# Patient Record
Sex: Female | Born: 1985 | Race: Black or African American | Hispanic: No | Marital: Single | Smoking: Former smoker
Health system: Southern US, Community
[De-identification: ages and names within clinical notes are randomized; demographics above are authoritative.]

## PROBLEM LIST (undated history)

## (undated) ENCOUNTER — Inpatient Hospital Stay (HOSPITAL_COMMUNITY): Payer: Self-pay

## (undated) DIAGNOSIS — Z8619 Personal history of other infectious and parasitic diseases: Secondary | ICD-10-CM

## (undated) DIAGNOSIS — R87619 Unspecified abnormal cytological findings in specimens from cervix uteri: Secondary | ICD-10-CM

## (undated) DIAGNOSIS — O139 Gestational [pregnancy-induced] hypertension without significant proteinuria, unspecified trimester: Secondary | ICD-10-CM

## (undated) DIAGNOSIS — I10 Essential (primary) hypertension: Secondary | ICD-10-CM

## (undated) DIAGNOSIS — L0291 Cutaneous abscess, unspecified: Secondary | ICD-10-CM

## (undated) DIAGNOSIS — IMO0002 Reserved for concepts with insufficient information to code with codable children: Secondary | ICD-10-CM

## (undated) DIAGNOSIS — A599 Trichomoniasis, unspecified: Secondary | ICD-10-CM

## (undated) DIAGNOSIS — A6009 Herpesviral infection of other urogenital tract: Secondary | ICD-10-CM

## (undated) DIAGNOSIS — R87629 Unspecified abnormal cytological findings in specimens from vagina: Secondary | ICD-10-CM

## (undated) DIAGNOSIS — K219 Gastro-esophageal reflux disease without esophagitis: Secondary | ICD-10-CM

## (undated) DIAGNOSIS — IMO0001 Reserved for inherently not codable concepts without codable children: Secondary | ICD-10-CM

## (undated) HISTORY — DX: Unspecified abnormal cytological findings in specimens from vagina: R87.629

## (undated) HISTORY — PX: DILATION AND CURETTAGE OF UTERUS: SHX78

## (undated) HISTORY — DX: Personal history of other infectious and parasitic diseases: Z86.19

## (undated) HISTORY — PX: EYE SURGERY: SHX253

---
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
## 2008-01-29 ENCOUNTER — Ambulatory Visit: Payer: Self-pay | Admitting: Physician Assistant

## 2008-01-29 ENCOUNTER — Inpatient Hospital Stay (HOSPITAL_COMMUNITY): Admission: AD | Admit: 2008-01-29 | Discharge: 2008-01-29 | Payer: Self-pay | Admitting: Obstetrics & Gynecology

## 2008-02-10 ENCOUNTER — Inpatient Hospital Stay (HOSPITAL_COMMUNITY): Admission: AD | Admit: 2008-02-10 | Discharge: 2008-02-10 | Payer: Self-pay | Admitting: Obstetrics & Gynecology

## 2008-02-18 ENCOUNTER — Inpatient Hospital Stay (HOSPITAL_COMMUNITY): Admission: RE | Admit: 2008-02-18 | Discharge: 2008-02-18 | Payer: Self-pay | Admitting: Obstetrics & Gynecology

## 2008-03-11 ENCOUNTER — Ambulatory Visit (HOSPITAL_COMMUNITY): Admission: RE | Admit: 2008-03-11 | Discharge: 2008-03-11 | Payer: Self-pay | Admitting: Obstetrics

## 2008-03-11 ENCOUNTER — Encounter (INDEPENDENT_AMBULATORY_CARE_PROVIDER_SITE_OTHER): Payer: Self-pay | Admitting: Obstetrics

## 2008-04-11 ENCOUNTER — Emergency Department (HOSPITAL_COMMUNITY): Admission: EM | Admit: 2008-04-11 | Discharge: 2008-04-11 | Payer: Self-pay | Admitting: Emergency Medicine

## 2009-05-06 ENCOUNTER — Emergency Department (HOSPITAL_COMMUNITY): Admission: EM | Admit: 2009-05-06 | Discharge: 2009-05-06 | Payer: Self-pay | Admitting: Emergency Medicine

## 2009-05-09 ENCOUNTER — Emergency Department (HOSPITAL_COMMUNITY): Admission: EM | Admit: 2009-05-09 | Discharge: 2009-05-09 | Payer: Self-pay | Admitting: Family Medicine

## 2010-01-30 ENCOUNTER — Encounter: Payer: Self-pay | Admitting: Obstetrics

## 2010-03-29 LAB — CULTURE, ROUTINE-ABSCESS

## 2010-04-04 IMAGING — US US OB COMP LESS 14 WK
2 series · 14 of 28 positions shown · non-contrast
Comparison: none

OBSTETRICAL ULTRASOUND:
 This ultrasound exam was performed in the [HOSPITAL] Ultrasound Department.  The OB US report was generated in the AS system, and faxed to the ordering physician.  This report is also available in [REDACTED] PACS.

[Series 1: us ob comp less 14 wks · 47 acquisitions, 13 frames shown (1 of 2)]
[im 2/47]
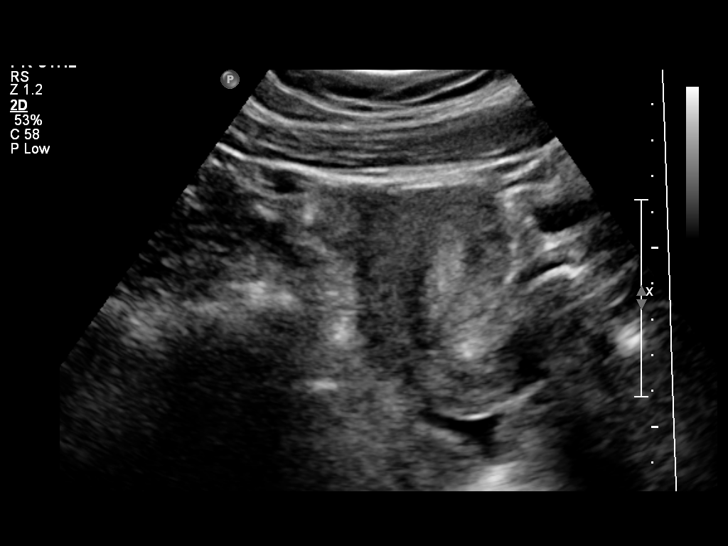
[im 6/47]
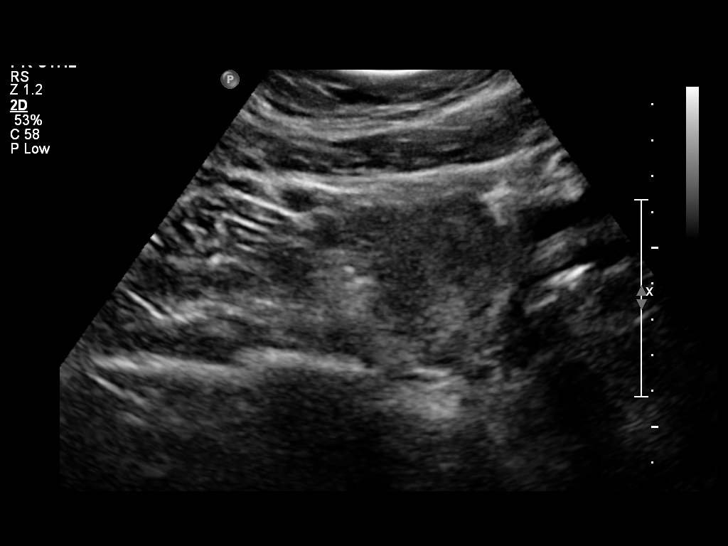
[im 9/47]
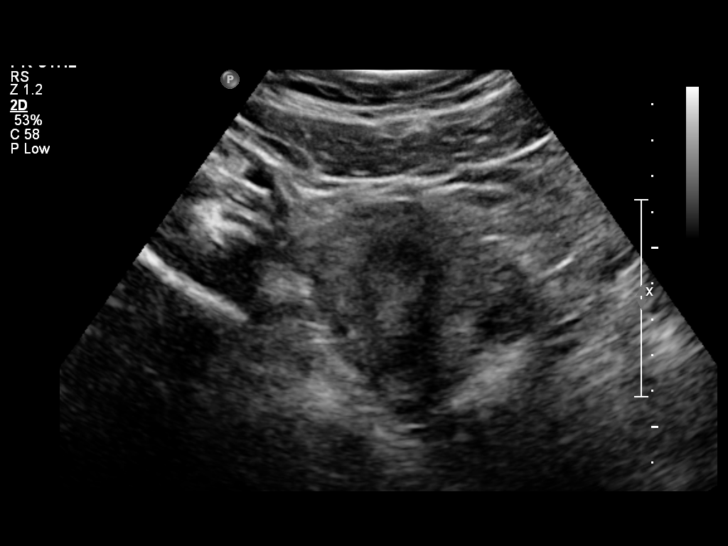
[im 13/47]
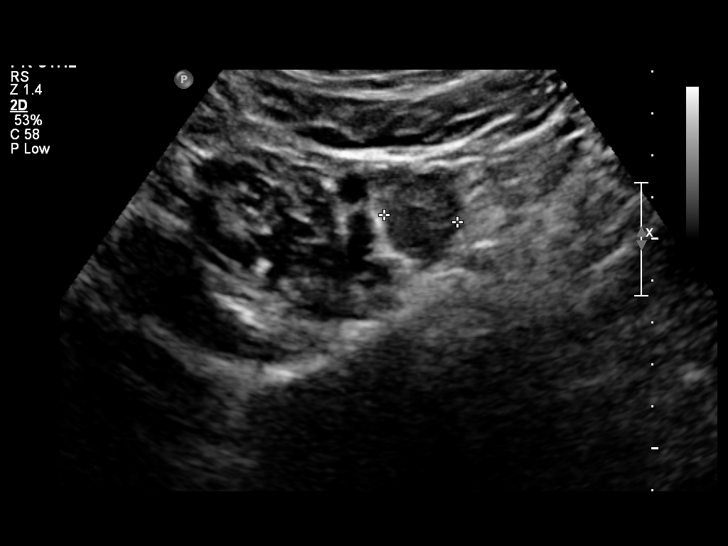
[im 16/47]
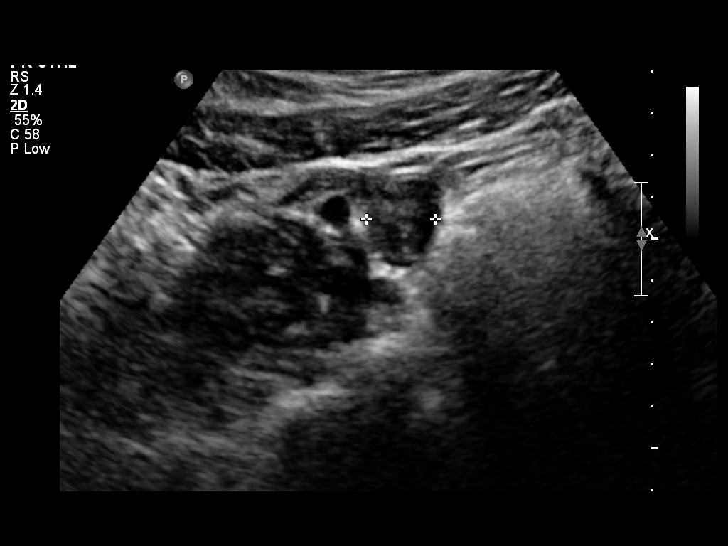
[im 20/47]
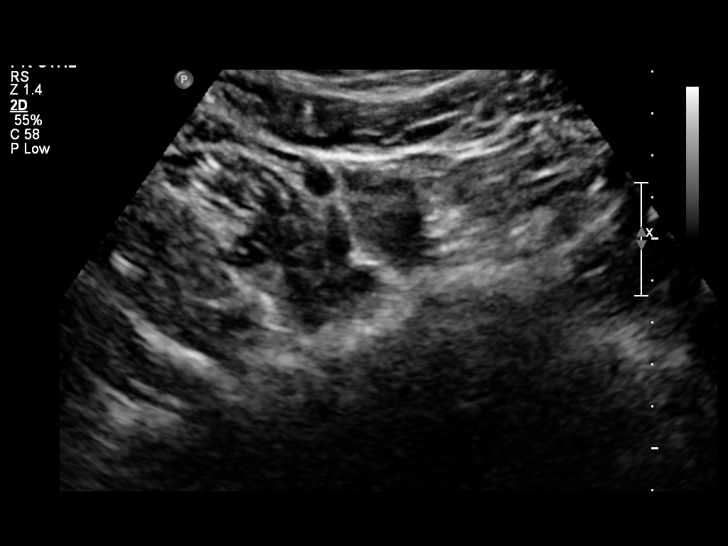
[im 24/47]
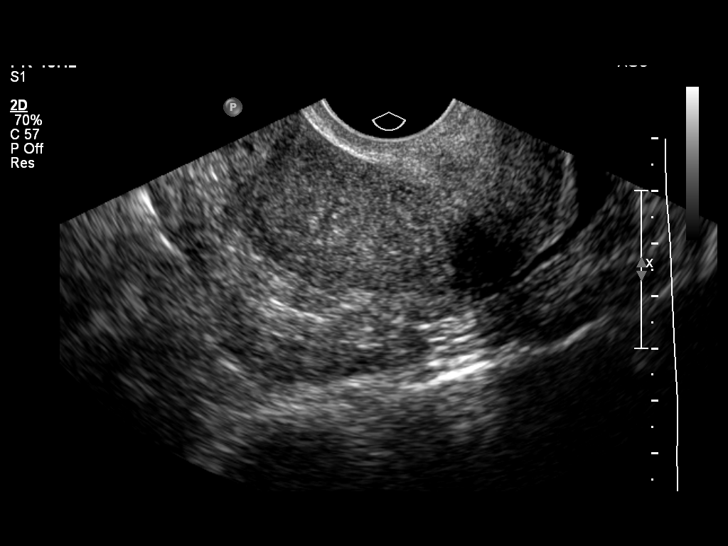
[im 27/47]
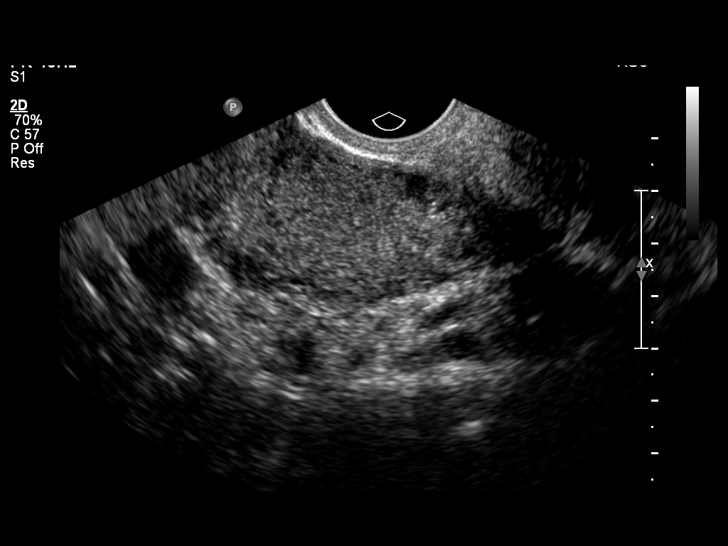
[im 31/47]
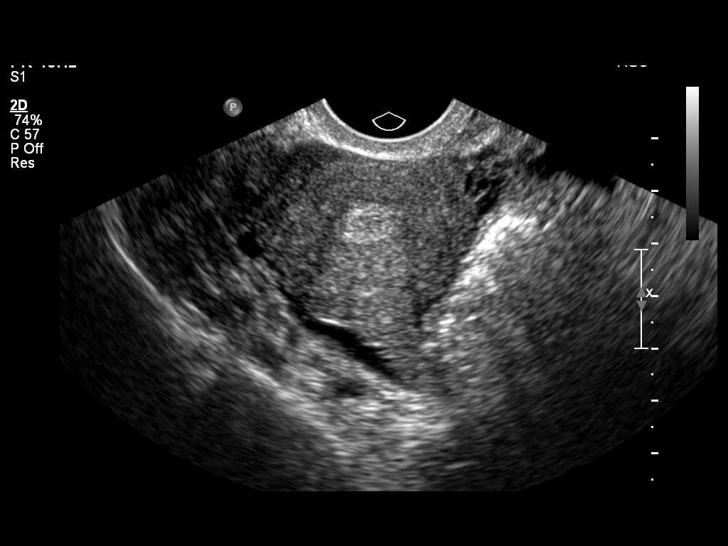
[im 34/47]
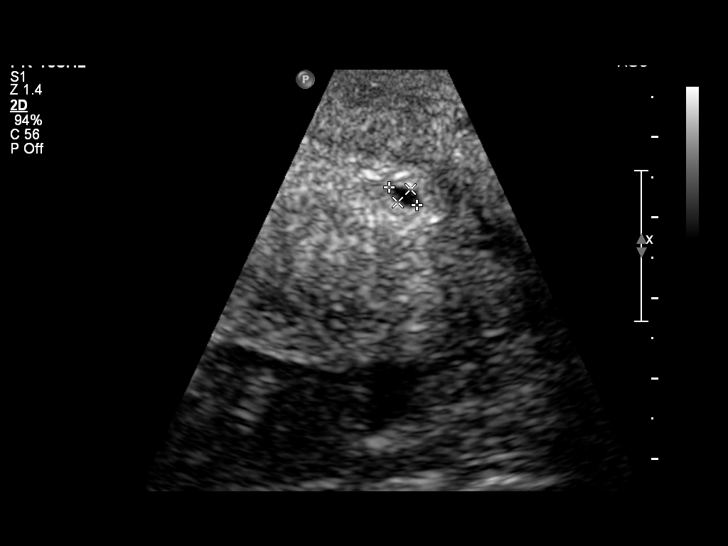
[im 38/47]
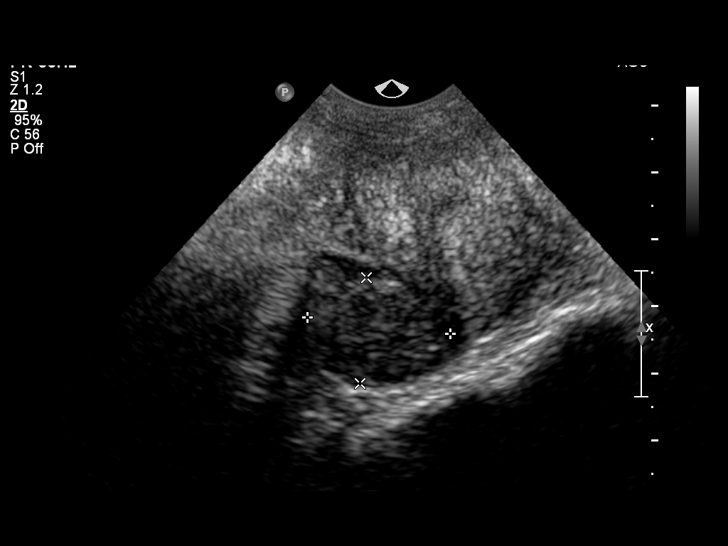
[im 41/47]
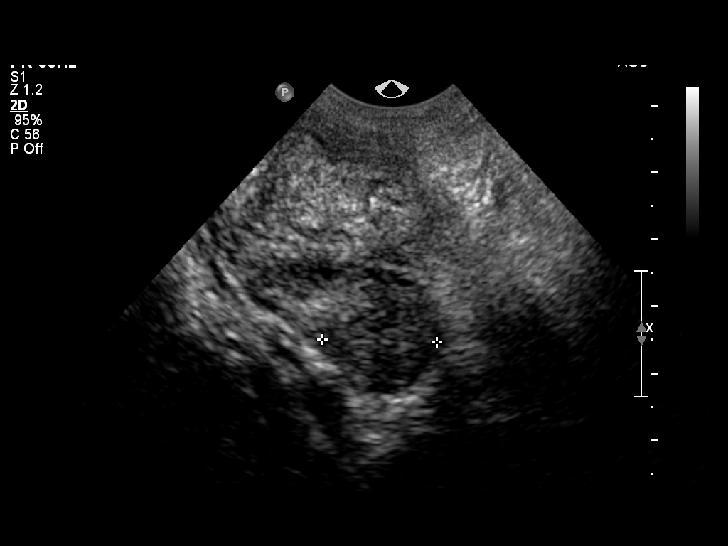
[im 45/47]
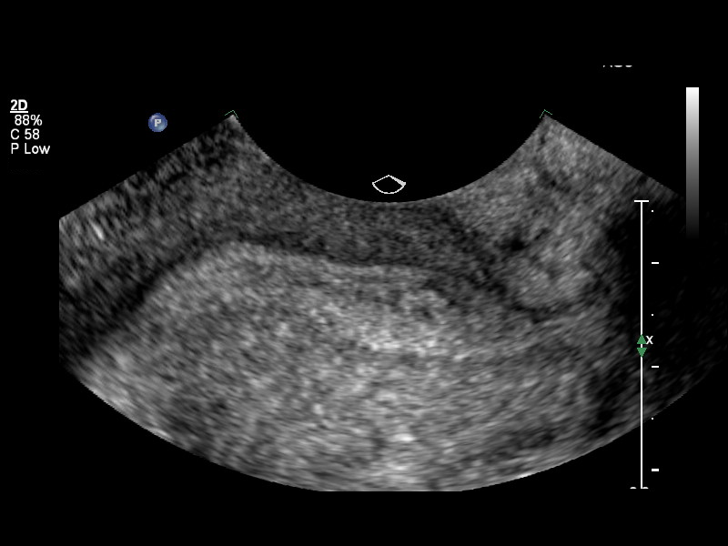

[Series 4: us ob comp less 14 wks · 1 of 256 frames shown (2 of 2)]
[frame 129/256]
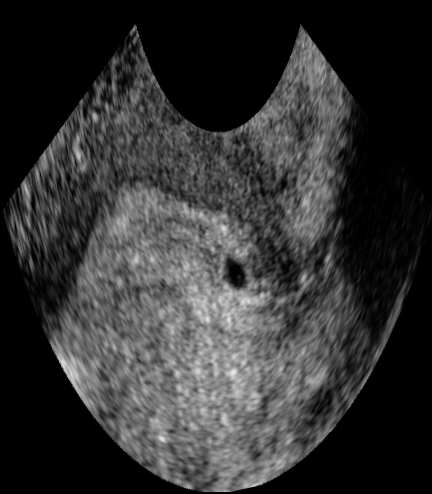

[14 of 28 positions shown; findings below may reference images not displayed]

IMPRESSION: See AS Obstetric US report.

## 2010-04-25 LAB — GC/CHLAMYDIA PROBE AMP, GENITAL

## 2010-04-25 LAB — URINALYSIS, ROUTINE W REFLEX MICROSCOPIC
Hgb urine dipstick: NEGATIVE
Specific Gravity, Urine: 1.025 (ref 1.005–1.030)
Urobilinogen, UA: 0.2 mg/dL (ref 0.0–1.0)

## 2010-04-25 LAB — CBC
Hemoglobin: 14.1 g/dL (ref 12.0–15.0)
Platelets: 224 10*3/uL (ref 150–400)
RDW: 14.9 % (ref 11.5–15.5)

## 2010-04-25 LAB — POCT PREGNANCY, URINE

## 2010-04-25 LAB — ABO/RH: ABO/RH(D): B POS

## 2010-04-26 LAB — WET PREP, GENITAL: Yeast Wet Prep HPF POC: NONE SEEN

## 2010-04-26 LAB — CBC
Hemoglobin: 13.3 g/dL (ref 12.0–15.0)
Platelets: 203 10*3/uL (ref 150–400)
RDW: 14.5 % (ref 11.5–15.5)

## 2010-05-24 NOTE — Op Note (Signed)
NAMEBENIGNA, DELISI              ACCOUNT NO.:  [id number]   MEDICAL RECORD NO.:  [id number]          PATIENT TYPE:  AMB   LOCATION:  SDC                           FACILITY:  [facility]   PHYSICIAN:  Kathreen Cosier, M.D.DATE OF BIRTH:  1985-07-21   DATE OF PROCEDURE:  03/11/2008  DATE OF DISCHARGE:                               OPERATIVE REPORT   PREOPERATIVE DIAGNOSIS:  Intrauterine fetal demise with twins at 7.5  weeks' gestation.   POSTOPERATIVE DIAGNOSIS:  Intrauterine fetal demise with twins at 7.5  weeks' gestation.   PROCEDURE:  Dilatation and evacuation.   DESCRIPTION OF PROCEDURE:  Using MAC, the patient's, lithotomy position,  perineum and vagina prepped and draped.  Bladder emptied with straight  catheter.  Bimanual exam was difficult because of the obesity of the  patient's abdomen, but it was felt that the uterus on both 12 weeks'  size.  Speculum placed in the vagina.  Cervix injected with 9 mL of 1%  Xylocaine.  Endometrial cavity sounded 10 cm.  Cervix dilated #27 Shawnie Pons  and #9 suction used to aspirate the uterine contents until the cavity  was clean.  Ultrasound was obtained because of the obesity of her  abdomen and this confirmed that the cavity was clean.  The patient  tolerated the procedure well and taken to recovery room in good  condition.            ______________________________  Kathreen Cosier, M.D.     BAM/MEDQ  D:  03/11/2008  T:  03/11/2008  Job:  [id number]

## 2010-05-27 NOTE — Op Note (Signed)
[place]  Patient:    Amber Duran, Amber Duran                     MRN: [id number] Proc. Date: 05/04/00 Adm. Date:  [id number] Attending:  Shara Blazing                           Operative Report  PREOPERATIVE DIAGNOSIS:  Chalazion, left upper eyelid with pyogenic granuloma.  POSTOPERATIVE DIAGNOSIS:  Chalazion, left upper eyelid with pyogenic granuloma.  PROCEDURE:  Excision of pyogenic granuloma, left upper eyelid with steroid injection into chalazion.  SURGEON:  Pasty Spillers. Maple Hudson, M.D.  ANESTHESIA:  General (mask inhalation).  COMPLICATIONS:  None.  DESCRIPTION OF PROCEDURE:  After routine preoperative evaluation including informed consent, the patient was taken to the operating room where she was identified by me.  General anesthesia was induced without difficulty after placement on appropriate monitors.  The skin around the right eye was prepped with 5% Betadine solution.  The pyogenic granuloma was located at the temporal lid margin of the left upper lid, and was found to measure approximately 4 mm in diameter, elevated approximately 2 mm.  It was excised in one piece with Westcott scissors. Approximately 0.3 cc of triamcinolone, 40 mg/cc was injected into the underlying chalazion.  Polysporin ophthalmic ointment was placed on the wound.  The patient was awakened without difficulty and taken to the recovery room in stable condition having suffered no intraoperative or immediate postoperative complications. DD:  05/04/00 TD:  05/05/00 Job: [id number]

## 2010-06-10 ENCOUNTER — Other Ambulatory Visit (HOSPITAL_COMMUNITY): Payer: Self-pay | Admitting: Obstetrics

## 2010-06-10 ENCOUNTER — Inpatient Hospital Stay (HOSPITAL_COMMUNITY)
Admission: AD | Admit: 2010-06-10 | Discharge: 2010-06-10 | Disposition: A | Discharge status: Home / Self Care | Payer: Medicaid Other | Source: Ambulatory Visit | Attending: Obstetrics | Admitting: Obstetrics

## 2010-06-10 ENCOUNTER — Encounter: Payer: Self-pay | Admitting: Obstetrics

## 2010-06-10 ENCOUNTER — Ambulatory Visit (HOSPITAL_COMMUNITY)
Admission: RE | Admit: 2010-06-10 | Discharge: 2010-06-10 | Disposition: A | Discharge status: Home / Self Care | Payer: Medicaid Other | Source: Ambulatory Visit | Attending: Obstetrics | Admitting: Obstetrics

## 2010-06-10 DIAGNOSIS — O035 Genital tract and pelvic infection following complete or unspecified spontaneous abortion: Secondary | ICD-10-CM

## 2010-06-10 DIAGNOSIS — Z3689 Encounter for other specified antenatal screening: Secondary | ICD-10-CM | POA: Insufficient documentation

## 2010-06-10 DIAGNOSIS — O021 Missed abortion: Secondary | ICD-10-CM

## 2010-06-10 DIAGNOSIS — O98519 Other viral diseases complicating pregnancy, unspecified trimester: Secondary | ICD-10-CM | POA: Insufficient documentation

## 2010-06-10 DIAGNOSIS — A6000 Herpesviral infection of urogenital system, unspecified: Secondary | ICD-10-CM | POA: Insufficient documentation

## 2010-06-10 LAB — URINALYSIS, ROUTINE W REFLEX MICROSCOPIC
Bilirubin Urine: NEGATIVE
Hgb urine dipstick: NEGATIVE
Ketones, ur: 15 mg/dL — AB
Protein, ur: NEGATIVE mg/dL
Urobilinogen, UA: 0.2 mg/dL (ref 0.0–1.0)

## 2010-06-11 LAB — GC/CHLAMYDIA PROBE AMP, GENITAL

## 2010-09-14 ENCOUNTER — Inpatient Hospital Stay (HOSPITAL_COMMUNITY)
Admission: AD | Admit: 2010-09-14 | Discharge: 2010-09-14 | Disposition: A | Discharge status: Home / Self Care | Payer: Medicaid Other | Source: Ambulatory Visit | Attending: Obstetrics | Admitting: Obstetrics

## 2010-09-14 ENCOUNTER — Inpatient Hospital Stay (HOSPITAL_COMMUNITY): Payer: Medicaid Other

## 2010-09-14 ENCOUNTER — Encounter (HOSPITAL_COMMUNITY): Payer: Self-pay | Admitting: *Deleted

## 2010-09-14 ENCOUNTER — Inpatient Hospital Stay (INDEPENDENT_AMBULATORY_CARE_PROVIDER_SITE_OTHER)
Admission: RE | Admit: 2010-09-14 | Discharge: 2010-09-14 | Disposition: A | Discharge status: Home / Self Care | Payer: Medicaid Other | Source: Ambulatory Visit | Attending: Family Medicine | Admitting: Family Medicine

## 2010-09-14 DIAGNOSIS — O021 Missed abortion: Secondary | ICD-10-CM | POA: Insufficient documentation

## 2010-09-14 DIAGNOSIS — N949 Unspecified condition associated with female genital organs and menstrual cycle: Secondary | ICD-10-CM

## 2010-09-14 DIAGNOSIS — Z331 Pregnant state, incidental: Secondary | ICD-10-CM

## 2010-09-14 HISTORY — DX: Cutaneous abscess, unspecified: L02.91

## 2010-09-14 HISTORY — DX: Herpesviral infection of other urogenital tract: A60.09

## 2010-09-14 HISTORY — DX: Trichomoniasis, unspecified: A59.9

## 2010-09-14 HISTORY — DX: Essential (primary) hypertension: I10

## 2010-09-14 LAB — CBC
HCT: 44.6 % (ref 36.0–46.0)
MCV: 91.2 fL (ref 78.0–100.0)
RBC: 4.89 MIL/uL (ref 3.87–5.11)
WBC: 9.7 10*3/uL (ref 4.0–10.5)

## 2010-09-14 LAB — POCT URINALYSIS DIP (DEVICE)
Ketones, ur: 80 mg/dL — AB
Nitrite: NEGATIVE

## 2010-09-14 MED ORDER — HYDROCODONE-ACETAMINOPHEN 5-500 MG PO TABS: 1.0000 | ORAL_TABLET | Freq: Four times a day (QID) | ORAL | Status: DC | PRN

## 2010-09-14 MED ORDER — PROMETHAZINE HCL 25 MG PO TABS: 25.0000 mg | ORAL_TABLET | Freq: Four times a day (QID) | ORAL | Status: DC | PRN

## 2010-09-14 NOTE — Progress Notes (Signed)
Started on Sun, kind like GI pain- crampy, took pepto-no relief.  Has continued, gotten worse. No bleeding.  Manson Passey d/c

## 2010-09-14 NOTE — ED Provider Notes (Signed)
History   Pt presents today c/o lower abd pain for the past 2 months. She had a missed AB several months ago and was given 2 doses of cytotec. She states she thinks she had menses in July but is not certain of the dates. She states she has recently been feeling like she had the flu and these sx have been present for the past 1-2 months.  Chief Complaint  Patient presents with  . Abdominal Pain   HPI  OB History    Grav Para Term Preterm Abortions TAB SAB Ect Mult Living   1               No past medical history on file.  No past surgical history on file.  No family history on file.  History  Substance Use Topics  . Smoking status: Not on file  . Smokeless tobacco: Not on file  . Alcohol Use: Not on file    Allergies: No Known Allergies  Prescriptions prior to admission  Medication Sig Dispense Refill  . oxyCODONE-acetaminophen (PERCOCET) 5-325 MG per tablet Take 1 tablet by mouth every 4 (four) hours as needed. For pain       . valACYclovir (VALTREX) 500 MG tablet Take 500 mg by mouth 2 (two) times daily.          Review of Systems  Constitutional: Positive for malaise/fatigue. Negative for fever.  Cardiovascular: Negative for chest pain.  Gastrointestinal: Positive for abdominal pain. Negative for nausea, vomiting and diarrhea.  Genitourinary: Negative for dysuria, urgency, frequency and hematuria.  Neurological: Negative for dizziness and headaches.  Psychiatric/Behavioral: Negative for depression and suicidal ideas.   Physical Exam   Blood pressure 112/72, pulse 76, temperature 98.7 F (37.1 C), temperature source Oral, resp. rate 20, height 5\' 2"  (1.575 m), weight 162 lb (73.483 kg).  Physical Exam  MAU Course  Procedures  Results for orders placed during the hospital encounter of 09/14/10 (from the past 24 hour(s))  CBC     Status: Normal   Collection Time   09/14/10  3:56 PM      Component Value Range   WBC 9.7  4.0 - 10.5 (K/uL)   RBC 4.89  3.87 - 5.11  (MIL/uL)   Hemoglobin 14.9  12.0 - 15.0 (g/dL)   HCT 30.8  65.7 - 84.6 (%)   MCV 91.2  78.0 - 100.0 (fL)   MCH 30.5  26.0 - 34.0 (pg)   MCHC 33.4  30.0 - 36.0 (g/dL)   RDW 96.2  95.2 - 84.1 (%)   Platelets 164  150 - 400 (K/uL)  HCG, QUANTITATIVE, PREGNANCY     Status: Abnormal   Collection Time   09/14/10  3:56 PM      Component Value Range   hCG, Beta Chain, Quant, S 15 (*) <5 (mIU/mL)   US shows IUFD at 7wks.  Discussed pt with Dr. Gaynell Face. He wishes to see pt in his office tomorrow at 1pm.   Assessment and Plan  Missed AB with failed cytotec x 2: discussed with pt at length. Will give Rx for phenergan and lortab. She will f/u with Dr. Gaynell Face tomorrow at 1pm. Discussed diet, activity, risks, and precautions.  Clinton Gallant. Rice III, DrHSc, MPAS, PA-C  09/14/2010, 4:21 PM   Henrietta Hoover, PA 09/14/10 1814

## 2010-09-15 ENCOUNTER — Encounter (HOSPITAL_COMMUNITY): Payer: Self-pay | Admitting: *Deleted

## 2010-09-15 ENCOUNTER — Other Ambulatory Visit: Payer: Self-pay | Admitting: Obstetrics

## 2010-09-15 LAB — GC/CHLAMYDIA PROBE AMP, GENITAL
Chlamydia, DNA Probe: NEGATIVE
GC Probe Amp, Genital: NEGATIVE

## 2010-09-15 NOTE — H&P (Signed)
NAMEISATOU, Amber Duran              ACCOUNT NO.:  [id number]  MEDICAL RECORD NO.:  [id number]  LOCATION:  PERIO                         FACILITY:  [facility]  PHYSICIAN:  Kathreen Cosier, M.D.DATE OF BIRTH:  1985-05-14  DATE OF ADMISSION:  09/15/2010 DATE OF DISCHARGE:                             HISTORY & PHYSICAL   HISTORY OF PRESENT ILLNESS:  The patient is a 25 year old gravida 4, para 0-0-4-0, who in June had an intrauterine fetal demise, received Cytotec and passed some tissue.  Since that time the patient states that she has had occasional cramping and also some dark red bleeding, came to the hospital yesterday and an ultrasound showed a 7-week fetal demise. So the patient is for D and E.  PAST MEDICAL HISTORY:  She has a history of herpes with occasional outbreaks.  She also had hypertension at one time, blood pressures normal.  REVIEW OF SYSTEMS:  Negative.  FAMILY HISTORY:  Negative.  PHYSICAL EXAMINATION:  GENERAL:  A well-developed female in no distress. HEENT: Negative. LUNGS:  Clear. HEART:  Regular rhythm.  No murmurs or gallops. BREASTS:  No masses. ABDOMEN:  Negative. PELVIC:  Uterus 6-8 weeks' size.  Negative adnexa. VAGINA:  External genitalia normal. EXTREMITIES:  Negative.          ______________________________ Kathreen Cosier, M.D.     BAM/MEDQ  D:  09/15/2010  T:  09/15/2010  Job:  [id number]

## 2010-09-16 ENCOUNTER — Other Ambulatory Visit: Payer: Self-pay | Admitting: Obstetrics

## 2010-09-20 ENCOUNTER — Other Ambulatory Visit: Payer: Self-pay

## 2010-09-21 ENCOUNTER — Ambulatory Visit (HOSPITAL_COMMUNITY)
Admission: RE | Admit: 2010-09-21 | Discharge: 2010-09-21 | Disposition: A | Discharge status: Home / Self Care | Payer: Medicaid Other | Source: Ambulatory Visit | Attending: Obstetrics | Admitting: Obstetrics

## 2010-09-21 ENCOUNTER — Encounter (HOSPITAL_COMMUNITY): Payer: Self-pay | Admitting: Anesthesiology

## 2010-09-21 ENCOUNTER — Encounter (HOSPITAL_COMMUNITY)
Admission: RE | Disposition: A | Discharge status: Home / Self Care | Payer: Self-pay | Source: Ambulatory Visit | Attending: Obstetrics

## 2010-09-21 ENCOUNTER — Ambulatory Visit (HOSPITAL_COMMUNITY): Payer: Medicaid Other | Admitting: Anesthesiology

## 2010-09-21 ENCOUNTER — Encounter (HOSPITAL_COMMUNITY): Payer: Self-pay | Admitting: *Deleted

## 2010-09-21 ENCOUNTER — Other Ambulatory Visit: Payer: Self-pay | Admitting: Obstetrics

## 2010-09-21 DIAGNOSIS — IMO0002 Reserved for concepts with insufficient information to code with codable children: Secondary | ICD-10-CM

## 2010-09-21 DIAGNOSIS — O021 Missed abortion: Secondary | ICD-10-CM | POA: Insufficient documentation

## 2010-09-21 HISTORY — DX: Reserved for inherently not codable concepts without codable children: IMO0001

## 2010-09-21 HISTORY — PX: DILATION AND EVACUATION: SHX1459

## 2010-09-21 HISTORY — DX: Gastro-esophageal reflux disease without esophagitis: K21.9

## 2010-09-21 LAB — ABO/RH: ABO/RH(D): B POS

## 2010-09-21 SURGERY — DILATION AND EVACUATION, UTERUS
Anesthesia: Monitor Anesthesia Care

## 2010-09-21 MED ORDER — LACTATED RINGERS IV SOLN
INTRAVENOUS | Status: DC
  Administered 2010-09-21 (×2): 1000 mL via INTRAVENOUS

## 2010-09-21 MED ORDER — PANTOPRAZOLE SODIUM 40 MG PO TBEC
DELAYED_RELEASE_TABLET | ORAL | Status: AC
  Administered 2010-09-21: 40 mg via ORAL
  Filled 2010-09-21: qty 1

## 2010-09-21 MED ORDER — OXYTOCIN 10 UNIT/ML IJ SOLN
INTRAMUSCULAR | Status: AC
  Filled 2010-09-21: qty 1

## 2010-09-21 MED ORDER — OXYTOCIN 10 UNIT/ML IJ SOLN
INTRAMUSCULAR | Status: DC | PRN
  Administered 2010-09-21: 10 [IU] via INTRAMUSCULAR

## 2010-09-21 MED ORDER — MIDAZOLAM HCL 2 MG/2ML IJ SOLN
INTRAMUSCULAR | Status: AC
  Filled 2010-09-21: qty 2

## 2010-09-21 MED ORDER — FENTANYL CITRATE 0.05 MG/ML IJ SOLN
25.0000 ug | INTRAMUSCULAR | Status: DC | PRN
  Administered 2010-09-21: 50 ug via INTRAVENOUS

## 2010-09-21 MED ORDER — LACTATED RINGERS IV SOLN
INTRAVENOUS | Status: DC | PRN
  Administered 2010-09-21: 08:00:00 via INTRAVENOUS

## 2010-09-21 MED ORDER — FENTANYL CITRATE 0.05 MG/ML IJ SOLN
INTRAMUSCULAR | Status: AC
  Filled 2010-09-21: qty 2

## 2010-09-21 MED ORDER — FENTANYL CITRATE 0.05 MG/ML IJ SOLN
INTRAMUSCULAR | Status: DC | PRN
  Administered 2010-09-21 (×2): 50 ug via INTRAVENOUS

## 2010-09-21 MED ORDER — MIDAZOLAM HCL 5 MG/5ML IJ SOLN
INTRAMUSCULAR | Status: DC | PRN
  Administered 2010-09-21: 1 mg via INTRAVENOUS

## 2010-09-21 MED ORDER — PROPOFOL 10 MG/ML IV EMUL
INTRAVENOUS | Status: DC | PRN
  Administered 2010-09-21: 100 mg via INTRAVENOUS
  Administered 2010-09-21: 20 mg via INTRAVENOUS

## 2010-09-21 MED ORDER — FENTANYL CITRATE 0.05 MG/ML IJ SOLN
INTRAMUSCULAR | Status: AC
  Administered 2010-09-21: 50 ug via INTRAVENOUS
  Filled 2010-09-21: qty 2

## 2010-09-21 MED ORDER — PROPOFOL 10 MG/ML IV EMUL
INTRAVENOUS | Status: AC
  Filled 2010-09-21: qty 20

## 2010-09-21 MED ORDER — PANTOPRAZOLE SODIUM 40 MG PO TBEC
40.0000 mg | DELAYED_RELEASE_TABLET | Freq: Every day | ORAL | Status: DC
  Administered 2010-09-21: 40 mg via ORAL

## 2010-09-21 MED ORDER — LIDOCAINE HCL (CARDIAC) 20 MG/ML IV SOLN
INTRAVENOUS | Status: AC
  Filled 2010-09-21: qty 5

## 2010-09-21 MED ORDER — ONDANSETRON HCL 4 MG/2ML IJ SOLN
INTRAMUSCULAR | Status: AC
  Filled 2010-09-21: qty 2

## 2010-09-21 SURGICAL SUPPLY — 19 items
CATH ROBINSON RED A/P 16FR (CATHETERS) ×2 IMPLANT
CLOTH BEACON ORANGE TIMEOUT ST (SAFETY) ×2 IMPLANT
DECANTER SPIKE VIAL GLASS SM (MISCELLANEOUS) ×2 IMPLANT
DRAPE UTILITY XL STRL (DRAPES) ×2 IMPLANT
GLOVE BIO SURGEON STRL SZ8.5 (GLOVE) ×4 IMPLANT
GOWN PREVENTION PLUS LG XLONG (DISPOSABLE) ×2 IMPLANT
GOWN PREVENTION PLUS XXLARGE (GOWN DISPOSABLE) ×2 IMPLANT
KIT BERKELEY 1ST TRIMESTER 3/8 (MISCELLANEOUS) ×2 IMPLANT
NEEDLE SPNL 22GX3.5 QUINCKE BK (NEEDLE) ×2 IMPLANT
NS IRRIG 1000ML POUR BTL (IV SOLUTION) ×2 IMPLANT
PACK VAGINAL MINOR WOMEN LF (CUSTOM PROCEDURE TRAY) ×2 IMPLANT
PAD PREP 24X48 CUFFED NSTRL (MISCELLANEOUS) ×2 IMPLANT
SET BERKELEY SUCTION TUBING (SUCTIONS) ×2 IMPLANT
SYR CONTROL 10ML LL (SYRINGE) ×2 IMPLANT
TOWEL OR 17X24 6PK STRL BLUE (TOWEL DISPOSABLE) ×4 IMPLANT
VACURETTE 10 RIGID CVD (CANNULA) ×2 IMPLANT
VACURETTE 7MM CVD STRL WRAP (CANNULA) IMPLANT
VACURETTE 8 RIGID CVD (CANNULA) ×2 IMPLANT
VACURETTE 9 RIGID CVD (CANNULA) ×2 IMPLANT

## 2010-09-21 NOTE — Transfer of Care (Signed)
Immediate Anesthesia Transfer of Care Note  Patient: Amber Duran  Procedure(s) Performed:  DILATATION AND EVACUATION (D&E)  Patient Location: PACU  Anesthesia Type: General  Level of Consciousness: awake, alert , patient cooperative and responds to stimulation  Airway & Oxygen Therapy: Patient Spontanous Breathing and Patient connected to nasal cannula oxygen  Post-op Assessment: Report given to PACU RN, Post -op Vital signs reviewed and stable and Patient moving all extremities  Post vital signs: Reviewed and stable  Complications: No apparent anesthesia complications

## 2010-09-21 NOTE — Anesthesia Preprocedure Evaluation (Addendum)
Anesthesia Evaluation  Name, MR# and DOB Patient awake  General Assessment Comment  Reviewed: Allergy & Precautions, H&P , NPO status , Patient's Chart, lab work & pertinent test results, reviewed documented beta blocker date and time   History of Anesthesia Complications Negative for: history of anesthetic complications  Airway Mallampati: I TM Distance: >3 FB Neck ROM: full    Dental  (+) Teeth Intact   Pulmonary  clear to auscultation  breath sounds clear to auscultation none    Cardiovascular hypertension, regular Normal    Neuro/Psych Negative Neurological ROS  Negative Psych ROS  GI/Hepatic/Renal   negative Liver ROS  negative Renal ROS   GERD      Endo/Other  Negative Endocrine ROS (+)      Abdominal   Musculoskeletal   Hematology negative hematology ROS (+)   Peds  Reproductive/Obstetrics (+) Pregnancy (7 week missed ab)    Anesthesia Other Findings            Anesthesia Physical Anesthesia Plan  ASA: II  Anesthesia Plan: MAC and General   Post-op Pain Management:    Induction:   Airway Management Planned:   Additional Equipment:   Intra-op Plan:   Post-operative Plan:   Informed Consent: I have reviewed the patients History and Physical, chart, labs and discussed the procedure including the risks, benefits and alternatives for the proposed anesthesia with the patient or authorized representative who has indicated his/her understanding and acceptance.   Dental Advisory Given  Plan Discussed with: CRNA and Surgeon  Anesthesia Plan Comments:        Anesthesia Quick Evaluation

## 2010-09-21 NOTE — Anesthesia Postprocedure Evaluation (Signed)
Anesthesia Post Note  Patient: Amber Duran  Procedure(s) Performed:  DILATATION AND EVACUATION (D&E)  Anesthesia type: GA  Patient location: PACU  Post pain: Pain level controlled  Post assessment: Post-op Vital signs reviewed  Last Vitals:  Filed Vitals:   09/21/10 0900  BP: 109/64  Pulse: 76  Temp:   Resp: 16    Post vital signs: Reviewed  Level of consciousness: sedated  Complications: No apparent anesthesia complications

## 2010-09-21 NOTE — Op Note (Signed)
Preop diagnosis 7 week IUFD Postop diagnosis the same Anesthesia Gen. Procedure DNE On the general anesthesia patient in lithotomy position perineum and vagina prepped and draped Bladder emptied with straight catheter bimanual exam revealed the uterus to be 8 weeks size retroverted Cervix injected with 10 cc 1% Xylocaine Cavity sounded 10 cm and it was posterior Cervix dilated to #25 Shawnie Pons and a #9 suction inserted the cavity was curetted to clean a small amount of tissue obtained Sponge forcep inserted and the cavity was clean Sharp curettage performed no tissue obtained Patient tolerated the procedure well taken to recovery room in good condition And the dictation dictated by Dr. Gaynell Face 09/21/2010

## 2010-09-22 ENCOUNTER — Encounter (HOSPITAL_COMMUNITY): Payer: Self-pay | Admitting: Obstetrics

## 2010-09-22 ENCOUNTER — Other Ambulatory Visit: Payer: Self-pay

## 2010-10-10 DEATH — deceased

## 2010-10-12 ENCOUNTER — Ambulatory Visit (HOSPITAL_COMMUNITY)
Admission: RE | Admit: 2010-10-12 | Discharge: 2010-10-12 | Disposition: A | Discharge status: Home / Self Care | Payer: Medicaid Other | Source: Ambulatory Visit | Attending: Obstetrics | Admitting: Obstetrics

## 2010-10-12 ENCOUNTER — Encounter (HOSPITAL_COMMUNITY): Payer: Self-pay

## 2010-10-12 DIAGNOSIS — D689 Coagulation defect, unspecified: Secondary | ICD-10-CM | POA: Insufficient documentation

## 2010-10-12 DIAGNOSIS — O10019 Pre-existing essential hypertension complicating pregnancy, unspecified trimester: Secondary | ICD-10-CM | POA: Insufficient documentation

## 2010-10-12 DIAGNOSIS — O2620 Pregnancy care for patient with recurrent pregnancy loss, unspecified trimester: Secondary | ICD-10-CM | POA: Insufficient documentation

## 2010-10-12 NOTE — Progress Notes (Signed)
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 Gaynell Face.  The patient is a 25 y.o. G4P0040, with an EDD of 01/15/2011, by Last Menstrual Period dating method.    Review of Systems A comprehensive review of systems was negative.  Patient History OB History    Grav Para Term Preterm Abortions TAB SAB Ect Mult Living   4    4 2 2    0     # Outc Date GA Lbr Len/2nd Wgt Sex Del Anes PTL Lv   1 TAB  [redacted]w[redacted]d          2 TAB  [redacted]w[redacted]d          3 SAB  102w0d          4 SAB  [redacted]w[redacted]d             Past Medical History  Diagnosis Date  . Genital herpes in women     8 months   . Trichomonas   . Abscess     underarms  . Reflux     diet controlled - no meds  . GERD (gastroesophageal reflux disease)     diet controlled - no meds  . Hypertension      no meds    Past Surgical History  Procedure Date  . Dilation and curettage of uterus   . Eye surgery   . Dilation and evacuation 09/21/2010    Procedure: DILATATION AND EVACUATION (D&E);  Surgeon: Kathreen Cosier, MD;  Location: [facility];  Service: Gynecology;  Laterality: N/A;    History   Social History  . Marital Status: Single    Spouse Name: N/A    Number of Children: N/A  . Years of Education: N/A   Social History Main Topics  . Smoking status: Former Smoker -- 0.2 packs/day for 1 years    Types: Cigarettes    Quit date: 09/15/1998  . Smokeless tobacco: Never Used  . Alcohol Use: No  . Drug Use: No  . Sexually Active: Yes    Birth Control/ Protection: None   Other Topics Concern  . Not on file   Social History Narrative  . No narrative on file    No family history on file. In  addition, the patient has no family history of mental retardation, birth defects, or genetic diseases.  Physical Examination There were no vitals filed for this visit. General appearance - alert, well appearing, and in no distress, oriented to person, place, and time and overweight but not obese.  She is presently losing weight through physical activities.  Assessment and Recommendations 1. Patient wishes to pursue potential cause of  recurrent abortion, based on latest two SAB's.  I recommend:  A. Antiphospholipid syndrome antibody assessment; obtained today  B. Maternal karyotype, obtained today;  C. Assessment of uterine anatomy to rule out anomalies or submucous myomas.  D. She has no metabolic disorders for further lab testing ( diabetes, thyroid dysfunction) Thank you for the opportunity to work with Amber Duran. I spent 30 minutes in face to face consultation with the patient.    Wandy Bossler,JOE

## 2010-10-26 ENCOUNTER — Encounter (HOSPITAL_COMMUNITY): Payer: Self-pay | Admitting: Obstetrics and Gynecology

## 2010-10-31 ENCOUNTER — Encounter: Payer: Self-pay | Admitting: Obstetrics

## 2010-11-22 LAB — US OB COMP LESS 14 WKS

## 2011-01-30 ENCOUNTER — Other Ambulatory Visit: Payer: Self-pay

## 2011-06-26 ENCOUNTER — Encounter (HOSPITAL_COMMUNITY): Payer: Self-pay | Admitting: *Deleted

## 2011-06-26 ENCOUNTER — Emergency Department (HOSPITAL_COMMUNITY)
Admission: EM | Admit: 2011-06-26 | Discharge: 2011-06-26 | Disposition: A | Discharge status: Home / Self Care | Payer: Medicaid Other | Attending: Emergency Medicine | Admitting: Emergency Medicine

## 2011-06-26 DIAGNOSIS — M549 Dorsalgia, unspecified: Secondary | ICD-10-CM

## 2011-06-26 DIAGNOSIS — M545 Low back pain, unspecified: Secondary | ICD-10-CM | POA: Insufficient documentation

## 2011-06-26 DIAGNOSIS — Z87891 Personal history of nicotine dependence: Secondary | ICD-10-CM | POA: Insufficient documentation

## 2011-06-26 LAB — PREGNANCY, URINE: Preg Test, Ur: NEGATIVE

## 2011-06-26 LAB — URINALYSIS, ROUTINE W REFLEX MICROSCOPIC
Hgb urine dipstick: NEGATIVE
Ketones, ur: NEGATIVE mg/dL
Protein, ur: NEGATIVE mg/dL
Urobilinogen, UA: 0.2 mg/dL (ref 0.0–1.0)

## 2011-06-26 MED ORDER — CYCLOBENZAPRINE HCL 10 MG PO TABS: 10.0000 mg | ORAL_TABLET | Freq: Three times a day (TID) | ORAL | Status: AC | PRN

## 2011-06-26 NOTE — ED Notes (Signed)
Pt states lower back pain for the past couple of days worse yesterday. Pt states unable to sit stand or move without pain. Pain is across lower back.

## 2011-06-26 NOTE — Discharge Instructions (Signed)
Ibuprofen 600 mg three times daily for the next five days.  Flexeril as prescribed for pain not relieved with ibuprofen.   Back Pain, Adult Low back pain is very common. About 1 in 5 people have back pain.The cause of low back pain is rarely dangerous. The pain often gets better over time.About half of people with a sudden onset of back pain feel better in just 2 weeks. About 8 in 10 people feel better by 6 weeks.  CAUSES Some common causes of back pain include:  Strain of the muscles or ligaments supporting the spine.   Wear and tear (degeneration) of the spinal discs.   Arthritis.   Direct injury to the back.  DIAGNOSIS Most of the time, the direct cause of low back pain is not known.However, back pain can be treated effectively even when the exact cause of the pain is unknown.Answering your caregiver's questions about your overall health and symptoms is one of the most accurate ways to make sure the cause of your pain is not dangerous. If your caregiver needs more information, he or she may order lab work or imaging tests (X-rays or MRIs).However, even if imaging tests show changes in your back, this usually does not require surgery. HOME CARE INSTRUCTIONS For many people, back pain returns.Since low back pain is rarely dangerous, it is often a condition that people can learn to [facility] their own.   Remain active. It is stressful on the back to sit or stand in one place. Do not sit, drive, or stand in one place for more than 30 minutes at a time. Take short walks on level surfaces as soon as pain allows.Try to increase the length of time you walk each day.   Do not stay in bed.Resting more than 1 or 2 days can delay your recovery.   Do not avoid exercise or work.Your body is made to move.It is not dangerous to be active, even though your back may hurt.Your back will likely heal faster if you return to being active before your pain is gone.   Pay attention to your body when  you bend and lift. Many people have less discomfortwhen lifting if they bend their knees, keep the load close to their bodies,and avoid twisting. Often, the most comfortable positions are those that put less stress on your recovering back.   Find a comfortable position to sleep. Use a firm mattress and lie on your side with your knees slightly bent. If you lie on your back, put a pillow under your knees.   Only take over-the-counter or prescription medicines as directed by your caregiver. Over-the-counter medicines to reduce pain and inflammation are often the most helpful.Your caregiver may prescribe muscle relaxant drugs.These medicines help dull your pain so you can more quickly return to your normal activities and healthy exercise.   Put ice on the injured area.   Put ice in a plastic bag.   Place a towel between your skin and the bag.   Leave the ice on for 15 to 20 minutes, 3 to 4 times a day for the first 2 to 3 days. After that, ice and heat may be alternated to reduce pain and spasms.   Ask your caregiver about trying back exercises and gentle massage. This may be of some benefit.   Avoid feeling anxious or stressed.Stress increases muscle tension and can worsen back pain.It is important to recognize when you are anxious or stressed and learn ways to manage it.Exercise is a  great option.  SEEK MEDICAL CARE IF:  You have pain that is not relieved with rest or medicine.   You have pain that does not improve in 1 week.   You have new symptoms.   You are generally not feeling well.  SEEK IMMEDIATE MEDICAL CARE IF:   You have pain that radiates from your back into your legs.   You develop new bowel or bladder control problems.   You have unusual weakness or numbness in your arms or legs.   You develop nausea or vomiting.   You develop abdominal pain.   You feel faint.  Document Released: 12/26/2004 Document Revised: 12/15/2010 Document Reviewed:  05/16/2010 [facility] Patient Information 2012 [place], [place].

## 2011-06-26 NOTE — ED Notes (Signed)
MD at bedside. 

## 2011-06-26 NOTE — ED Provider Notes (Signed)
History     CSN: [id number]  Arrival date & time 06/26/11  0700   First MD Initiated Contact with Patient 06/26/11 [phone]      Chief Complaint  Patient presents with  . Back Pain    (Consider location/radiation/quality/duration/timing/severity/associated sxs/prior treatment) Patient is a 26 y.o. female presenting with back pain. The history is provided by the patient.  Back Pain  This is a new problem. The current episode started 2 days ago. The problem occurs constantly. The problem has been gradually worsening. The pain is associated with no known injury. The pain is present in the lumbar spine. The quality of the pain is described as aching. The pain does not radiate. The pain is moderate. The symptoms are aggravated by bending, twisting and certain positions. The pain is the same all the time. Pertinent negatives include no fever, no abdominal pain, no dysuria, no pelvic pain, no leg pain, no paresthesias, no tingling and no weakness. She has tried nothing for the symptoms.    Past Medical History  Diagnosis Date  . Genital herpes in women     8 months   . Trichomonas   . Abscess     underarms  . Reflux     diet controlled - no meds  . GERD (gastroesophageal reflux disease)     diet controlled - no meds  . Hypertension      no meds    Past Surgical History  Procedure Date  . Dilation and curettage of uterus   . Eye surgery   . Dilation and evacuation 09/21/2010    Procedure: DILATATION AND EVACUATION (D&E);  Surgeon: Kathreen Cosier, MD;  Location: [facility];  Service: Gynecology;  Laterality: N/A;    Family History  Problem Relation Age of Onset  . Miscarriages / [place] Mother   . Hypertension Mother   . Hypertension Father   . Miscarriages / Stillbirths Maternal Aunt   . Crohn's disease Maternal Aunt   . Hypertension Maternal Grandmother   . Hypertension Maternal Grandfather     History  Substance Use Topics  . Smoking status: Former Smoker -- 0.2  packs/day for 1 years    Types: Cigarettes    Quit date: 09/15/1998  . Smokeless tobacco: Never Used  . Alcohol Use: No    OB History    Grav Para Term Preterm Abortions TAB SAB Ect Mult Living   4    4 2 2    0      Review of Systems  Constitutional: Negative for fever.  Gastrointestinal: Negative for abdominal pain.  Genitourinary: Negative for dysuria and pelvic pain.  Musculoskeletal: Positive for back pain.  Neurological: Negative for tingling, weakness and paresthesias.  All other systems reviewed and are negative.    Allergies  Review of patient's allergies indicates no known allergies.  Home Medications  No current outpatient prescriptions on file.  BP 125/89  Pulse 89  Temp 98.8 F (37.1 C) (Oral)  Resp 18  SpO2 100%  LMP 04/10/2010  Physical Exam  Nursing note and vitals reviewed. Constitutional: She is oriented to person, place, and time. She appears well-developed and well-nourished. No distress.  HENT:  Head: Normocephalic and atraumatic.  Neck: Normal range of motion. Neck supple.  Cardiovascular: Normal rate and regular rhythm.   No murmur heard. Pulmonary/Chest: Effort normal and breath sounds normal. No respiratory distress. She has no wheezes.  Abdominal: Soft. Bowel sounds are normal. She exhibits no distension. There is no tenderness.  Musculoskeletal:  Normal range of motion.       There is ttp in the soft tissues of the lumbar spine.  Neurological: She is alert and oriented to person, place, and time.       DTR's are 2+ in the ble.  Strength is 5/5 in the ble.  Ambulatory without difficulty.   Skin: Skin is dry. She is not diaphoretic.    ED Course  Procedures (including critical care time)  Labs Reviewed - No data to display No results found.   No diagnosis found.    MDM  UA clear, symptoms sound musculoskeletal.  Will treat with nsaids, flexeril.          Geoffery Lyons, MD 06/26/11 [phone]

## 2011-09-24 ENCOUNTER — Inpatient Hospital Stay (HOSPITAL_COMMUNITY)
Admission: AD | Admit: 2011-09-24 | Discharge: 2011-09-24 | Disposition: A | Discharge status: Home / Self Care | Payer: Medicaid Other | Source: Ambulatory Visit | Attending: Obstetrics & Gynecology | Admitting: Obstetrics & Gynecology

## 2011-09-24 ENCOUNTER — Encounter (HOSPITAL_COMMUNITY): Payer: Self-pay | Admitting: *Deleted

## 2011-09-24 DIAGNOSIS — R51 Headache: Secondary | ICD-10-CM | POA: Insufficient documentation

## 2011-09-24 DIAGNOSIS — O99891 Other specified diseases and conditions complicating pregnancy: Secondary | ICD-10-CM | POA: Insufficient documentation

## 2011-09-24 DIAGNOSIS — O26899 Other specified pregnancy related conditions, unspecified trimester: Secondary | ICD-10-CM

## 2011-09-24 MED ORDER — ACETAMINOPHEN 500 MG PO TABS
1000.0000 mg | ORAL_TABLET | Freq: Once | ORAL | Status: AC
  Administered 2011-09-24: 1000 mg via ORAL
  Filled 2011-09-24: qty 2

## 2011-09-24 NOTE — MAU Provider Note (Signed)
History     CSN: [id number]  Arrival date and time: 09/24/11 1542   First Provider Initiated Contact with Patient 09/24/11 1637      Chief Complaint  Patient presents with  . Headache   HPI Amber Duran 26 y.o. [redacted]w[redacted]d  Comes to MAU with a headache for the past couple of days and today rates it at 8/10.  Has not taken medication for pain today.  States the sunlight was making the headache worse.  Has not had any problems with headaches in the past 5 years.  Has not yet started prenatal care.  OB History    Grav Para Term Preterm Abortions TAB SAB Ect Mult Living   5    4 2 2    0      Past Medical History  Diagnosis Date  . Genital herpes in women     8 months   . Trichomonas   . Abscess     underarms  . Reflux     diet controlled - no meds  . GERD (gastroesophageal reflux disease)     diet controlled - no meds  . Hypertension      no meds    Past Surgical History  Procedure Date  . Dilation and curettage of uterus   . Eye surgery   . Dilation and evacuation 09/21/2010    Procedure: DILATATION AND EVACUATION (D&E);  Surgeon: Kathreen Cosier, MD;  Location: [facility];  Service: Gynecology;  Laterality: N/A;    Family History  Problem Relation Age of Onset  . Miscarriages / [place] Mother   . Hypertension Mother   . Hypertension Father   . Miscarriages / Stillbirths Maternal Aunt   . Crohn's disease Maternal Aunt   . Hypertension Maternal Grandmother   . Hypertension Maternal Grandfather     History  Substance Use Topics  . Smoking status: Former Smoker -- 0.2 packs/day for 1 years    Types: Cigarettes    Quit date: 09/15/1998  . Smokeless tobacco: Never Used  . Alcohol Use: No    Allergies: No Known Allergies  Prescriptions prior to admission  Medication Sig Dispense Refill  . acetaminophen (TYLENOL) 325 MG tablet Take 650 mg by mouth every 6 (six) hours as needed. For pain      . Prenatal Vit-Fe Fumarate-FA (PRENATAL MULTIVITAMIN) TABS Take 1  tablet by mouth daily.        Review of Systems  Constitutional: Negative for fever.  Gastrointestinal: Negative for nausea, vomiting, abdominal pain, diarrhea and constipation.  Genitourinary:       No vaginal discharge. No vaginal bleeding. No dysuria.  Neurological: Positive for headaches.   Physical Exam   Blood pressure 131/83, pulse 84, temperature 98.5 F (36.9 C), temperature source Oral, resp. rate 18, height 5\' 2"  (1.575 m), weight 77.474 kg (170 lb 12.8 oz), last menstrual period 06/27/2011.  Physical Exam  Nursing note and vitals reviewed. Constitutional: She is oriented to person, place, and time. She appears well-developed and well-nourished.  HENT:  Head: Normocephalic.  Eyes: EOM are normal.  Neck: Neck supple.  Musculoskeletal: Normal range of motion.  Neurological: She is alert and oriented to person, place, and time.  Skin: Skin is warm and dry.  Psychiatric: She has a normal mood and affect.    MAU Course  Procedures  MDM Reviewed appropriate diet and fluid intake.  Has only had carbs today.  Has not taken any medication today.  While headache is not completely relieved,  client is sitting up in bed, smiling, stating that she feels much better.  Assessment and Plan  Headache in pregnancy  Plan Drink at least 8 8-oz glasses of water every day. Take Tylenol 325 mg 2 tablets by mouth every 4 hours if needed for pain. May take one dose of Ibuprofen tonight if headache worsens.  Do not take ibuprofen beyond the next 24 hours. Begin prenatal care as soon as possible.  BURLESON,TERRI 09/24/2011, 4:48 PM

## 2011-09-24 NOTE — MAU Note (Signed)
Pt reports having headache  On and off for the past 4 days. Took tylenol last night with no relief. Has not taken any today. Phot sensitivity reported with it.

## 2011-10-11 ENCOUNTER — Other Ambulatory Visit (HOSPITAL_COMMUNITY): Payer: Self-pay | Admitting: Registered Nurse

## 2011-10-11 DIAGNOSIS — Z3689 Encounter for other specified antenatal screening: Secondary | ICD-10-CM

## 2011-10-11 LAB — OB RESULTS CONSOLE ABO/RH: RH Type: POSITIVE

## 2011-10-11 LAB — OB RESULTS CONSOLE RUBELLA ANTIBODY, IGM: Rubella: IMMUNE

## 2011-10-11 LAB — OB RESULTS CONSOLE RPR: RPR: NONREACTIVE

## 2011-11-03 ENCOUNTER — Ambulatory Visit (HOSPITAL_COMMUNITY)
Admission: RE | Admit: 2011-11-03 | Discharge: 2011-11-03 | Disposition: A | Discharge status: Home / Self Care | Payer: Medicaid Other | Source: Ambulatory Visit | Attending: Registered Nurse | Admitting: Registered Nurse

## 2011-11-03 DIAGNOSIS — Z3689 Encounter for other specified antenatal screening: Secondary | ICD-10-CM | POA: Insufficient documentation

## 2012-01-10 NOTE — L&D Delivery Note (Signed)
Pt progressed along a normal labor curve after she developed adequate contractions. She pushed for 1 hour and the VE was placed to shorten the second stage. Just prior to delivery the pt's temp was elevated. She was given Tylenol and Ancef. She delivered one live viable black female infant in the LOA position over an intact perineum. NICU present for delivery. Baby to NBN. Bilateral labial tears closed with 3-0 chromic. EBL-400cc. Placenta S/I/ Pt given Methergine for heavy bleeding.

## 2012-04-02 ENCOUNTER — Encounter (HOSPITAL_COMMUNITY): Payer: Self-pay | Admitting: *Deleted

## 2012-04-02 ENCOUNTER — Telehealth (HOSPITAL_COMMUNITY): Payer: Self-pay | Admitting: *Deleted

## 2012-04-02 NOTE — Telephone Encounter (Signed)
Preadmission screen  

## 2012-04-04 ENCOUNTER — Encounter (HOSPITAL_COMMUNITY): Payer: Self-pay | Admitting: *Deleted

## 2012-04-04 ENCOUNTER — Inpatient Hospital Stay (HOSPITAL_COMMUNITY)
Admission: AD | Admit: 2012-04-04 | Discharge: 2012-04-04 | Disposition: A | Discharge status: Home / Self Care | Payer: Medicaid Other | Source: Ambulatory Visit | Attending: Obstetrics and Gynecology | Admitting: Obstetrics and Gynecology

## 2012-04-04 DIAGNOSIS — O479 False labor, unspecified: Secondary | ICD-10-CM | POA: Insufficient documentation

## 2012-04-04 DIAGNOSIS — O2620 Pregnancy care for patient with recurrent pregnancy loss, unspecified trimester: Secondary | ICD-10-CM | POA: Insufficient documentation

## 2012-04-04 NOTE — OB Triage Note (Signed)
Pt c/o contractions since 0900.

## 2012-04-05 ENCOUNTER — Inpatient Hospital Stay (HOSPITAL_COMMUNITY): Payer: Medicaid Other | Admitting: Anesthesiology

## 2012-04-05 ENCOUNTER — Inpatient Hospital Stay (HOSPITAL_COMMUNITY)
Admission: AD | Admit: 2012-04-05 | Discharge: 2012-04-08 | DRG: 775 | Disposition: A | Discharge status: Home / Self Care | Payer: Medicaid Other | Source: Ambulatory Visit | Attending: Obstetrics and Gynecology | Admitting: Obstetrics and Gynecology

## 2012-04-05 ENCOUNTER — Encounter (HOSPITAL_COMMUNITY): Payer: Self-pay | Admitting: *Deleted

## 2012-04-05 ENCOUNTER — Encounter (HOSPITAL_COMMUNITY): Payer: Self-pay | Admitting: Anesthesiology

## 2012-04-05 DIAGNOSIS — O139 Gestational [pregnancy-induced] hypertension without significant proteinuria, unspecified trimester: Secondary | ICD-10-CM | POA: Diagnosis present

## 2012-04-05 HISTORY — DX: Unspecified abnormal cytological findings in specimens from cervix uteri: R87.619

## 2012-04-05 HISTORY — DX: Gestational (pregnancy-induced) hypertension without significant proteinuria, unspecified trimester: O13.9

## 2012-04-05 HISTORY — DX: Reserved for concepts with insufficient information to code with codable children: IMO0002

## 2012-04-05 LAB — COMPREHENSIVE METABOLIC PANEL
AST: 19 U/L (ref 0–37)
Albumin: 2.5 g/dL — ABNORMAL LOW (ref 3.5–5.2)
Calcium: 9.5 mg/dL (ref 8.4–10.5)
Creatinine, Ser: 0.71 mg/dL (ref 0.50–1.10)
GFR calc non Af Amer: >90 mL/min (ref >90)

## 2012-04-05 LAB — CBC
MCH: 29.3 pg (ref 26.0–34.0)
MCH: 29.6 pg (ref 26.0–34.0)
MCV: 84.6 fL (ref 78.0–100.0)
MCV: 85.8 fL (ref 78.0–100.0)
Platelets: 143 10*3/uL — ABNORMAL LOW (ref 150–400)
Platelets: 136 10*3/uL — ABNORMAL LOW (ref 150–400)
RBC: 4.52 MIL/uL (ref 3.87–5.11)
RDW: 14.8 % (ref 11.5–15.5)
RDW: 14.7 % (ref 11.5–15.5)

## 2012-04-05 LAB — TYPE AND SCREEN: Antibody Screen: NEGATIVE

## 2012-04-05 LAB — URINALYSIS, ROUTINE W REFLEX MICROSCOPIC
Ketones, ur: 15 mg/dL — AB
Nitrite: NEGATIVE
Specific Gravity, Urine: 1.01 (ref 1.005–1.030)
Urobilinogen, UA: 0.2 mg/dL (ref 0.0–1.0)
pH: 6.5 (ref 5.0–8.0)

## 2012-04-05 LAB — RPR: RPR Ser Ql: NONREACTIVE

## 2012-04-05 LAB — URIC ACID: Uric Acid, Serum: 5.8 mg/dL (ref 2.4–7.0)

## 2012-04-05 LAB — URINE MICROSCOPIC-ADD ON

## 2012-04-05 MED ORDER — CITRIC ACID-SODIUM CITRATE 334-500 MG/5ML PO SOLN: 30.0000 mL | ORAL | Status: DC | PRN

## 2012-04-05 MED ORDER — PHENYLEPHRINE 40 MCG/ML (10ML) SYRINGE FOR IV PUSH (FOR BLOOD PRESSURE SUPPORT)
80.0000 ug | PREFILLED_SYRINGE | INTRAVENOUS | Status: DC | PRN
  Filled 2012-04-05: qty 5
  Filled 2012-04-05: qty 2

## 2012-04-05 MED ORDER — IBUPROFEN 600 MG PO TABS
600.0000 mg | ORAL_TABLET | Freq: Four times a day (QID) | ORAL | Status: DC | PRN
  Administered 2012-04-06: 600 mg via ORAL
  Filled 2012-04-05: qty 1

## 2012-04-05 MED ORDER — EPHEDRINE 5 MG/ML INJ
10.0000 mg | INTRAVENOUS | Status: DC | PRN
  Filled 2012-04-05: qty 2
  Filled 2012-04-05: qty 4

## 2012-04-05 MED ORDER — PHENYLEPHRINE 40 MCG/ML (10ML) SYRINGE FOR IV PUSH (FOR BLOOD PRESSURE SUPPORT)
80.0000 ug | PREFILLED_SYRINGE | INTRAVENOUS | Status: DC | PRN
  Filled 2012-04-05: qty 2

## 2012-04-05 MED ORDER — LIDOCAINE HCL (PF) 1 % IJ SOLN
30.0000 mL | INTRAMUSCULAR | Status: DC | PRN
  Filled 2012-04-05 (×2): qty 30

## 2012-04-05 MED ORDER — BUTORPHANOL TARTRATE 1 MG/ML IJ SOLN
1.0000 mg | Freq: Once | INTRAMUSCULAR | Status: AC
  Administered 2012-04-05: 1 mg via INTRAVENOUS
  Filled 2012-04-05: qty 1

## 2012-04-05 MED ORDER — BUTORPHANOL TARTRATE 1 MG/ML IJ SOLN
1.0000 mg | INTRAMUSCULAR | Status: DC | PRN
  Administered 2012-04-05: 1 mg via INTRAVENOUS
  Filled 2012-04-05: qty 1

## 2012-04-05 MED ORDER — ACETAMINOPHEN 325 MG PO TABS
650.0000 mg | ORAL_TABLET | ORAL | Status: DC | PRN
  Administered 2012-04-05 – 2012-04-06 (×3): 650 mg via ORAL
  Filled 2012-04-05 (×3): qty 2

## 2012-04-05 MED ORDER — OXYTOCIN 40 UNITS IN LACTATED RINGERS INFUSION - SIMPLE MED
1.0000 m[IU]/min | INTRAVENOUS | Status: DC
  Administered 2012-04-05: 2 m[IU]/min via INTRAVENOUS
  Administered 2012-04-05: 8 m[IU]/min via INTRAVENOUS
  Administered 2012-04-05: 22 m[IU]/min via INTRAVENOUS
  Administered 2012-04-05: 16 m[IU]/min via INTRAVENOUS
  Filled 2012-04-05: qty 1000

## 2012-04-05 MED ORDER — LACTATED RINGERS IV SOLN
INTRAVENOUS | Status: DC
  Administered 2012-04-05 (×3): via INTRAVENOUS

## 2012-04-05 MED ORDER — DIPHENHYDRAMINE HCL 50 MG/ML IJ SOLN: 12.5000 mg | INTRAMUSCULAR | Status: DC | PRN

## 2012-04-05 MED ORDER — TERBUTALINE SULFATE 1 MG/ML IJ SOLN: 0.2500 mg | Freq: Once | INTRAMUSCULAR | Status: AC | PRN

## 2012-04-05 MED ORDER — EPHEDRINE 5 MG/ML INJ
10.0000 mg | INTRAVENOUS | Status: DC | PRN
  Filled 2012-04-05: qty 2

## 2012-04-05 MED ORDER — OXYCODONE-ACETAMINOPHEN 5-325 MG PO TABS: 1.0000 | ORAL_TABLET | ORAL | Status: DC | PRN

## 2012-04-05 MED ORDER — FENTANYL 2.5 MCG/ML BUPIVACAINE 1/10 % EPIDURAL INFUSION (WH - ANES)
14.0000 mL/h | INTRAMUSCULAR | Status: DC | PRN
  Administered 2012-04-05 – 2012-04-06 (×2): 14 mL/h via EPIDURAL
  Filled 2012-04-05 (×2): qty 125

## 2012-04-05 MED ORDER — LACTATED RINGERS IV SOLN
INTRAVENOUS | Status: DC
  Administered 2012-04-05 (×2): via INTRAUTERINE

## 2012-04-05 MED ORDER — FLEET ENEMA 7-19 GM/118ML RE ENEM: 1.0000 | ENEMA | RECTAL | Status: DC | PRN

## 2012-04-05 MED ORDER — OXYTOCIN 40 UNITS IN LACTATED RINGERS INFUSION - SIMPLE MED
62.5000 mL/h | INTRAVENOUS | Status: DC
  Administered 2012-04-06: 62.5 mL/h via INTRAVENOUS

## 2012-04-05 MED ORDER — LACTATED RINGERS IV SOLN
500.0000 mL | INTRAVENOUS | Status: DC | PRN
  Administered 2012-04-06: 500 mL via INTRAVENOUS

## 2012-04-05 MED ORDER — ONDANSETRON HCL 4 MG/2ML IJ SOLN: 4.0000 mg | Freq: Four times a day (QID) | INTRAMUSCULAR | Status: DC | PRN

## 2012-04-05 MED ORDER — OXYTOCIN BOLUS FROM INFUSION: 500.0000 mL | INTRAVENOUS | Status: DC

## 2012-04-05 MED ORDER — LIDOCAINE HCL (PF) 1 % IJ SOLN
INTRAMUSCULAR | Status: DC | PRN
  Administered 2012-04-05 (×4): 4 mL

## 2012-04-05 MED ORDER — LACTATED RINGERS IV SOLN
500.0000 mL | Freq: Once | INTRAVENOUS | Status: AC
  Administered 2012-04-05: 500 mL via INTRAVENOUS

## 2012-04-05 NOTE — Anesthesia Procedure Notes (Signed)
Epidural Patient location during procedure: OB Start time: 04/05/2012 4:52 PM  Staffing Performed by: anesthesiologist   Preanesthetic Checklist Completed: patient identified, site marked, surgical consent, pre-op evaluation, timeout performed, IV checked, risks and benefits discussed and monitors and equipment checked  Epidural Patient position: sitting Prep: site prepped and draped and DuraPrep Patient monitoring: continuous pulse ox and blood pressure Approach: midline Injection technique: LOR air  Needle:  Needle type: Tuohy  Needle gauge: 17 G Needle length: 9 cm and 9 Needle insertion depth: 7 cm Catheter type: closed end flexible Catheter size: 19 Gauge Catheter at skin depth: 12 cm Test dose: negative  Assessment Events: blood not aspirated, injection not painful, no injection resistance, negative IV test and no paresthesia  Additional Notes Discussed risk of headache, infection, bleeding, nerve injury and failed or incomplete block.  Patient voices understanding and wishes to proceed.  Epidural placed easily on first attempt.  No paresthesia.  Patient tolerated procedure well with no apparent complications.  Jasmine December, MD Reason for block:procedure for pain

## 2012-04-05 NOTE — MAU Note (Signed)
Contracting since yesterday, stronger and regular since 0500. No bleeding or leaking.  Was 2 cm when last checked, no problems with preg.

## 2012-04-05 NOTE — Progress Notes (Signed)
MD informed of lab results, SVE, FHR reassuring but not reactive... See admit orders.

## 2012-04-05 NOTE — H&P (Signed)
Pt  Is a 27 year old black female, G4P0030 at term who is admitted in early labor. She was noted to have slightly elevated blood pressures in the ER. PNC was complicated by a history of HSV. No symptoms in last week. No evidence of lesion on exam. Pt was 2 cm on admission. She had SROM with moderate meconium noted. An amnioinfusion was started. She also had inadequate contractions and pitocin was started. PMHx: see [facility]. PE; HEENT-wnl        ABD-gravid,non tender, palp ctxs        FHR- reactive IMP/ IUP in early labor.         Mild hypertension         H.O HSV- no lesions         -GBS          Moderate meconium PLAN/ admit

## 2012-04-05 NOTE — Anesthesia Preprocedure Evaluation (Signed)
Anesthesia Evaluation  Patient identified by MRN, date of birth, ID band Patient awake    Reviewed: Allergy & Precautions, H&P , NPO status , Patient's Chart, lab work & pertinent test results, reviewed documented beta blocker date and time   History of Anesthesia Complications Negative for: history of anesthetic complications  Airway Mallampati: III TM Distance: >3 FB Neck ROM: full    Dental  (+) Teeth Intact   Pulmonary neg pulmonary ROS,  breath sounds clear to auscultation        Cardiovascular hypertension, Rhythm:regular Rate:Normal     Neuro/Psych negative neurological ROS  negative psych ROS   GI/Hepatic Neg liver ROS, GERD-  Medicated,  Endo/Other  Morbid obesity  Renal/GU negative Renal ROS     Musculoskeletal   Abdominal   Peds  Hematology negative hematology ROS (+)   Anesthesia Other Findings   Reproductive/Obstetrics (+) Pregnancy                           Anesthesia Physical Anesthesia Plan  ASA: III  Anesthesia Plan: Epidural   Post-op Pain Management:    Induction:   Airway Management Planned:   Additional Equipment:   Intra-op Plan:   Post-operative Plan:   Informed Consent: I have reviewed the patients History and Physical, chart, labs and discussed the procedure including the risks, benefits and alternatives for the proposed anesthesia with the patient or authorized representative who has indicated his/her understanding and acceptance.     Plan Discussed with:   Anesthesia Plan Comments:         Anesthesia Quick Evaluation

## 2012-04-06 ENCOUNTER — Encounter (HOSPITAL_COMMUNITY): Payer: Self-pay | Admitting: Neonatology

## 2012-04-06 LAB — CBC
HCT: 35.8 % — ABNORMAL LOW (ref 36.0–46.0)
Platelets: 121 10*3/uL — ABNORMAL LOW (ref 150–400)
RDW: 14.6 % (ref 11.5–15.5)
WBC: 15.2 10*3/uL — ABNORMAL HIGH (ref 4.0–10.5)

## 2012-04-06 LAB — URINE CULTURE: Colony Count: >=100000

## 2012-04-06 MED ORDER — PRENATAL MULTIVITAMIN CH
1.0000 | ORAL_TABLET | Freq: Every day | ORAL | Status: DC
  Administered 2012-04-06 – 2012-04-08 (×3): 1 via ORAL
  Filled 2012-04-06 (×3): qty 1

## 2012-04-06 MED ORDER — CEFAZOLIN SODIUM-DEXTROSE 2-3 GM-% IV SOLR
2.0000 g | Freq: Three times a day (TID) | INTRAVENOUS | Status: DC
  Administered 2012-04-06: 2 g via INTRAVENOUS
  Filled 2012-04-06 (×2): qty 50

## 2012-04-06 MED ORDER — IBUPROFEN 600 MG PO TABS
600.0000 mg | ORAL_TABLET | Freq: Four times a day (QID) | ORAL | Status: DC
  Administered 2012-04-06 – 2012-04-08 (×9): 600 mg via ORAL
  Filled 2012-04-06 (×9): qty 1

## 2012-04-06 MED ORDER — MEASLES, MUMPS & RUBELLA VAC ~~LOC~~ INJ
0.5000 mL | INJECTION | Freq: Once | SUBCUTANEOUS | Status: DC
  Filled 2012-04-06: qty 0.5

## 2012-04-06 MED ORDER — SENNOSIDES-DOCUSATE SODIUM 8.6-50 MG PO TABS
2.0000 | ORAL_TABLET | Freq: Every day | ORAL | Status: DC
  Administered 2012-04-06 – 2012-04-07 (×2): 2 via ORAL

## 2012-04-06 MED ORDER — ONDANSETRON HCL 4 MG PO TABS: 4.0000 mg | ORAL_TABLET | ORAL | Status: DC | PRN

## 2012-04-06 MED ORDER — TETANUS-DIPHTH-ACELL PERTUSSIS 5-2.5-18.5 LF-MCG/0.5 IM SUSP: 0.5000 mL | Freq: Once | INTRAMUSCULAR | Status: DC

## 2012-04-06 MED ORDER — ONDANSETRON HCL 4 MG/2ML IJ SOLN: 4.0000 mg | INTRAMUSCULAR | Status: DC | PRN

## 2012-04-06 MED ORDER — METHYLERGONOVINE MALEATE 0.2 MG/ML IJ SOLN
INTRAMUSCULAR | Status: AC
  Filled 2012-04-06: qty 1

## 2012-04-06 MED ORDER — BENZOCAINE-MENTHOL 20-0.5 % EX AERO
1.0000 "application " | INHALATION_SPRAY | CUTANEOUS | Status: DC | PRN
  Administered 2012-04-06: 1 via TOPICAL
  Filled 2012-04-06: qty 56

## 2012-04-06 MED ORDER — OXYCODONE-ACETAMINOPHEN 5-325 MG PO TABS
1.0000 | ORAL_TABLET | ORAL | Status: DC | PRN
  Administered 2012-04-06 – 2012-04-07 (×4): 1 via ORAL
  Filled 2012-04-06 (×4): qty 1

## 2012-04-06 MED ORDER — ZOLPIDEM TARTRATE 5 MG PO TABS: 5.0000 mg | ORAL_TABLET | Freq: Every evening | ORAL | Status: DC | PRN

## 2012-04-06 MED ORDER — WITCH HAZEL-GLYCERIN EX PADS: 1.0000 "application " | MEDICATED_PAD | CUTANEOUS | Status: DC | PRN

## 2012-04-06 MED ORDER — METHYLERGONOVINE MALEATE 0.2 MG/ML IJ SOLN
0.2000 mg | Freq: Once | INTRAMUSCULAR | Status: AC
  Administered 2012-04-06: 0.2 mg via INTRAMUSCULAR

## 2012-04-06 MED ORDER — DIBUCAINE 1 % RE OINT: 1.0000 "application " | TOPICAL_OINTMENT | RECTAL | Status: DC | PRN

## 2012-04-06 MED ORDER — SIMETHICONE 80 MG PO CHEW: 80.0000 mg | CHEWABLE_TABLET | ORAL | Status: DC | PRN

## 2012-04-06 NOTE — Anesthesia Postprocedure Evaluation (Signed)
Anesthesia Post Note  Patient: Amber Duran  Procedure(s) Performed: * No procedures listed *  Anesthesia type: Epidural  Patient location: Mother/Baby  Post pain: Pain level controlled  Post assessment: Post-op Vital signs reviewed  Last Vitals:  Filed Vitals:   04/06/12 0700  BP: 122/82  Pulse: 90  Temp: 37.3 C  Resp: 20    Post vital signs: Reviewed  Level of consciousness:alert  Complications: No apparent anesthesia complications

## 2012-04-06 NOTE — Progress Notes (Signed)
PPD#0 Pt without complaints. Wants circ. VSSAF IMP/ doing well. Plan/ routine care.

## 2012-04-07 NOTE — Progress Notes (Signed)
PPD#1 Pt without complaints. Lochia-mild VSSAF IMP/stable. PLAN/ routine care.

## 2012-04-08 ENCOUNTER — Inpatient Hospital Stay (HOSPITAL_COMMUNITY): Admission: RE | Admit: 2012-04-08 | Payer: Medicaid Other | Source: Ambulatory Visit

## 2012-04-08 NOTE — Discharge Summary (Signed)
Obstetric Discharge Summary Reason for Admission: onset of labor Prenatal Procedures: ultrasound Intrapartum Procedures: vacuum, amnioinfusion for meconium Postpartum Procedures: none Complications-Operative and Postpartum: Bilateral labial lacerations, repaired Hemoglobin  Date Value Range Status  04/06/2012 12.5  12.0 - 15.0 g/dL Final     HCT  Date Value Range Status  04/06/2012 35.8* 36.0 - 46.0 % Final    Physical Exam:  General: alert Lochia: appropriate Uterine Fundus: firm  Discharge Diagnoses: Term Pregnancy-delivered  Discharge Information: Date: 04/08/2012 Activity: pelvic rest Diet: routine Medications: PNV and Ibuprofen Condition: stable Instructions: refer to practice specific booklet Discharge to: home Follow-up Information   Follow up with Amber Aland, MD. Schedule an appointment as soon as possible for a visit in 4 weeks.   Contact information:   [address] [place] [address] [phone]       Newborn Data: Live born female  Birth Weight: 9 lb 0.6 oz (4100 g) APGAR: 8, 9  Home with mother.  Amber Duran D 04/08/2012, 10:05 AM

## 2012-04-08 NOTE — Progress Notes (Signed)
UR chart review completed.  

## 2012-08-14 IMAGING — US US OB COMP LESS 14 WK
1 series · 14 of 28 positions shown · non-contrast
Comparison: none

[Series 1: us ob comp less 14 wks · 14 of 52 slices shown]
[im 2/52]
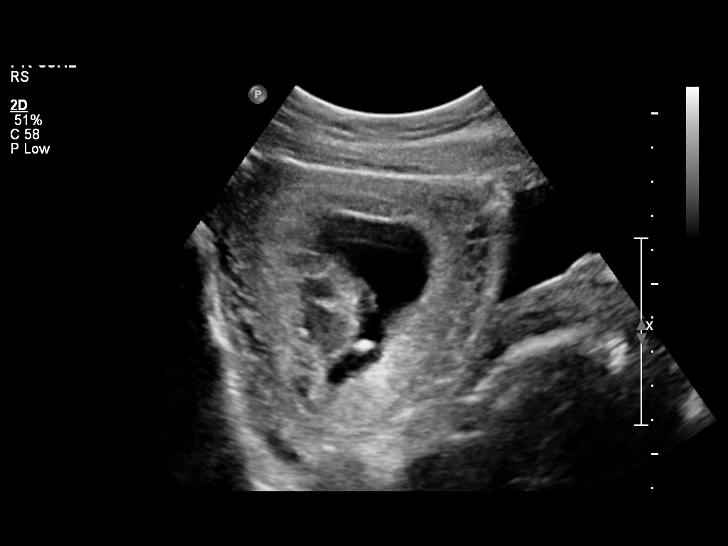
[im 6/52]
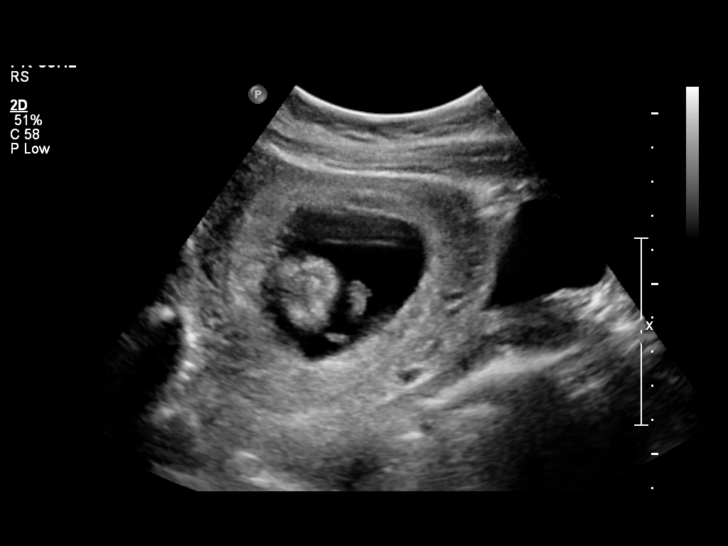
[im 10/52]
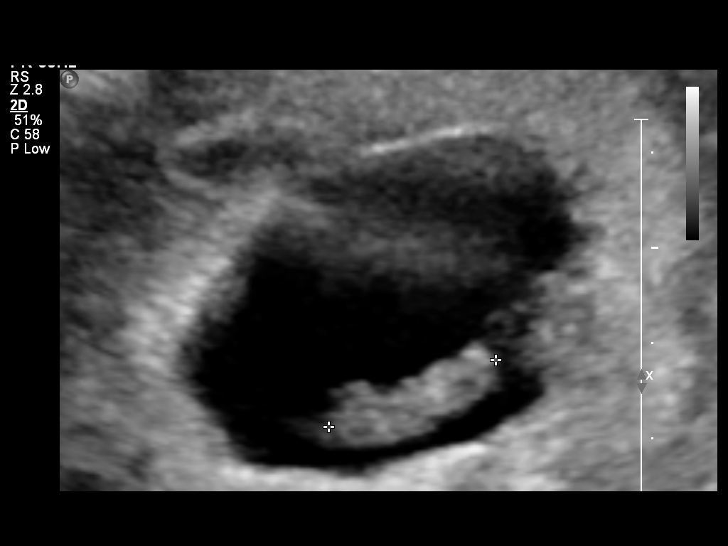
[im 14/52]
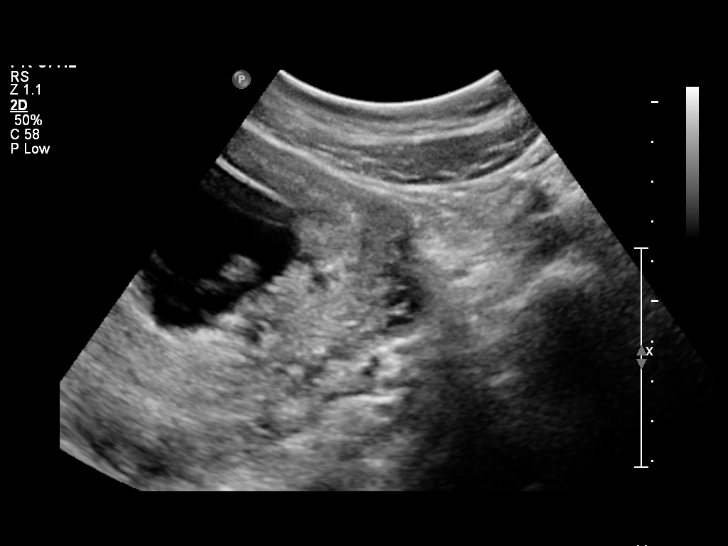
[im 18/52]
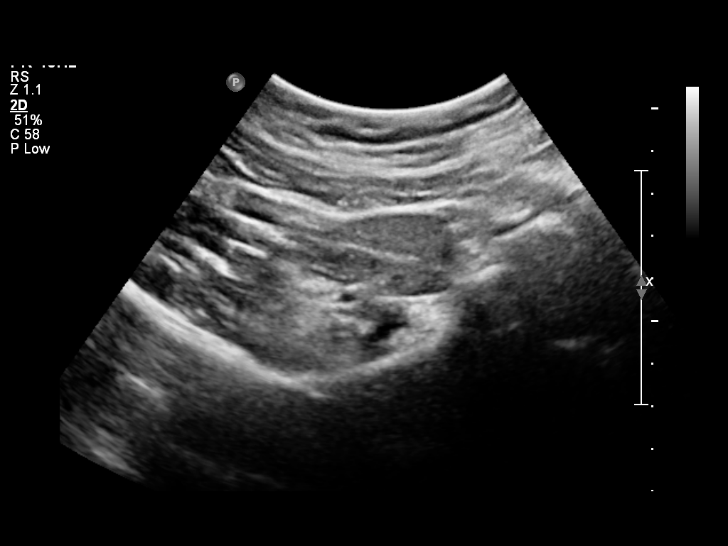
[im 21/52]
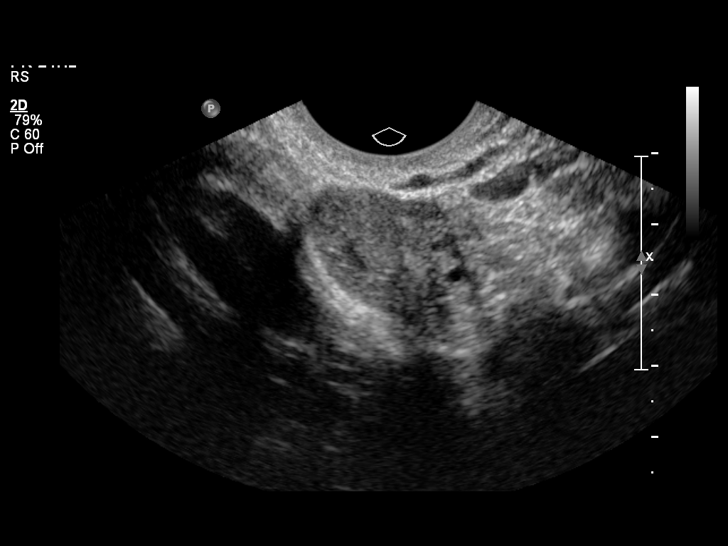
[im 25/52]
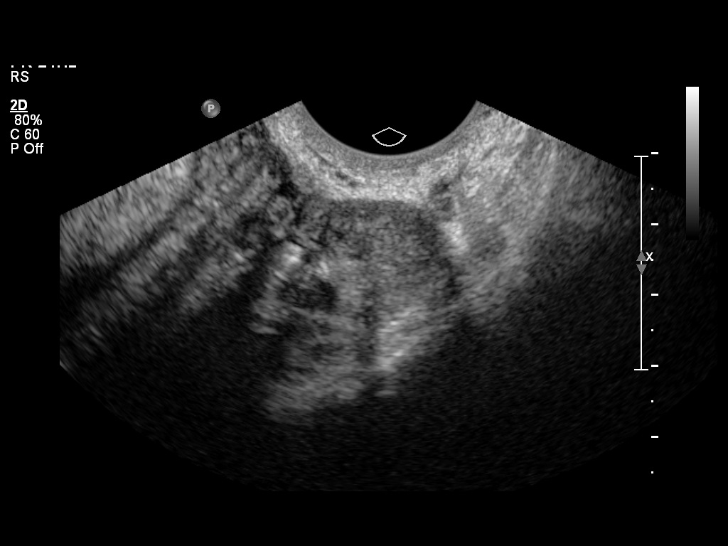
[im 29/52]
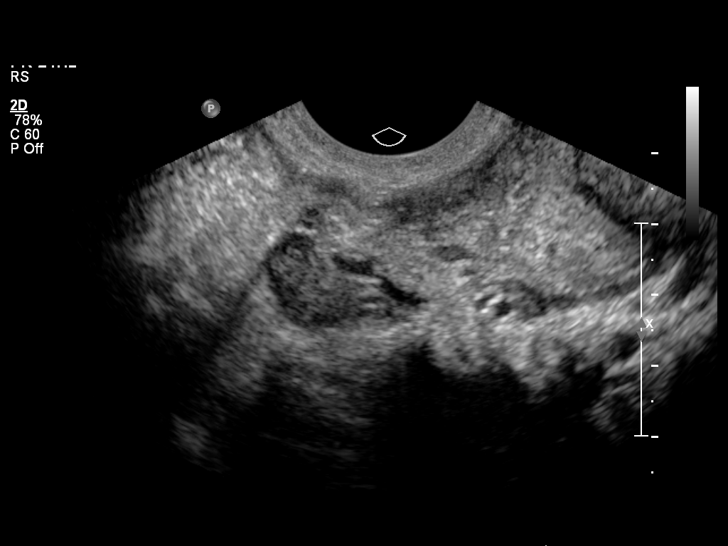
[im 33/52]
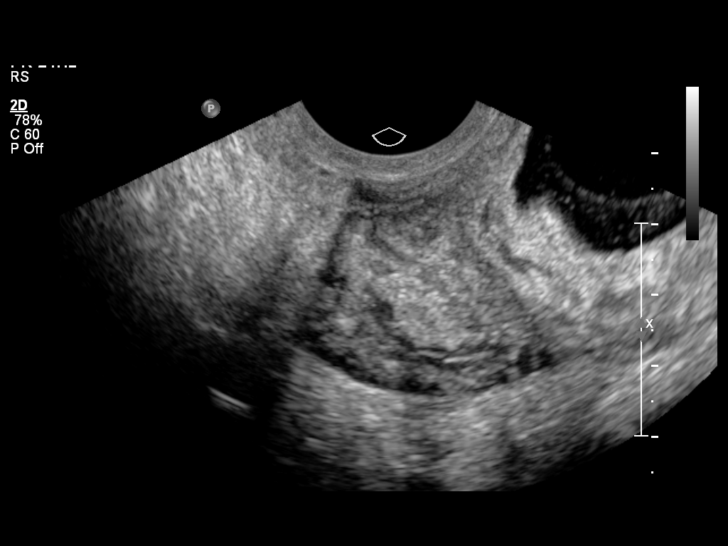
[im 36/52]
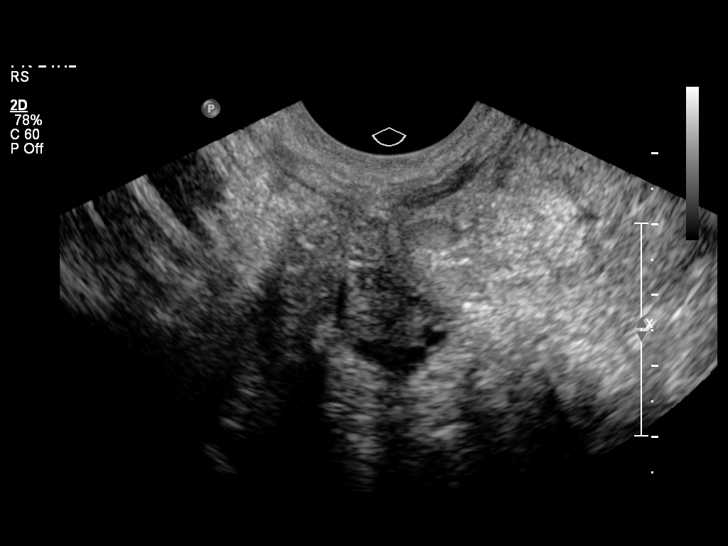
[im 40/52]
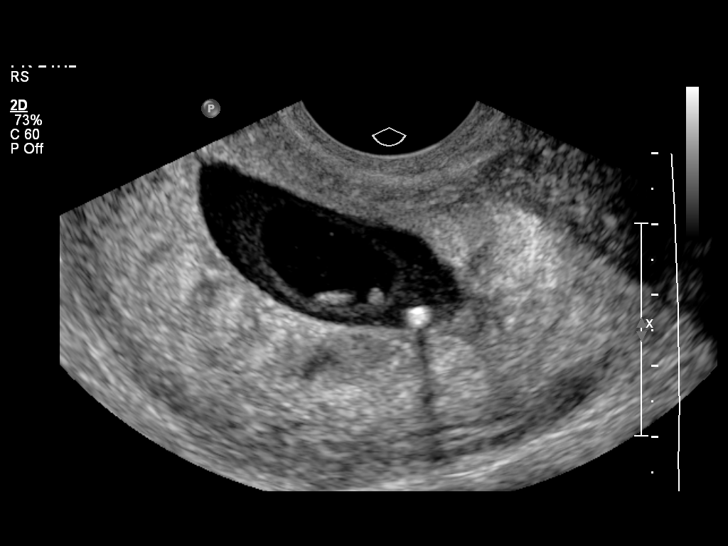
[im 44/52]
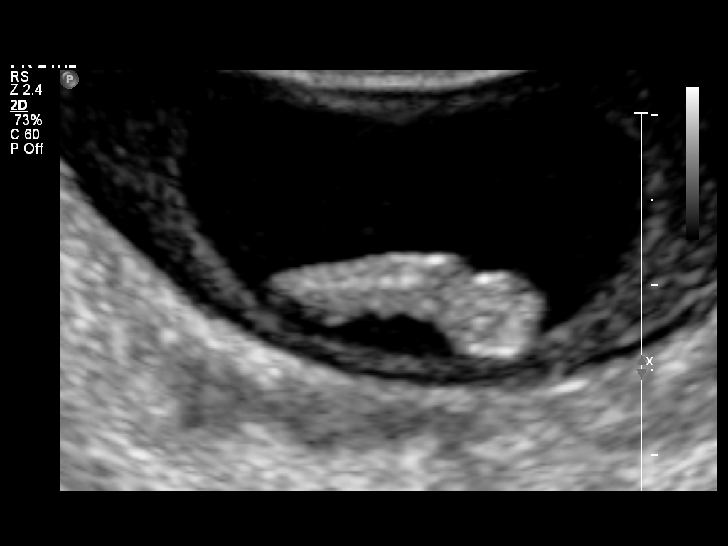
[im 48/52]
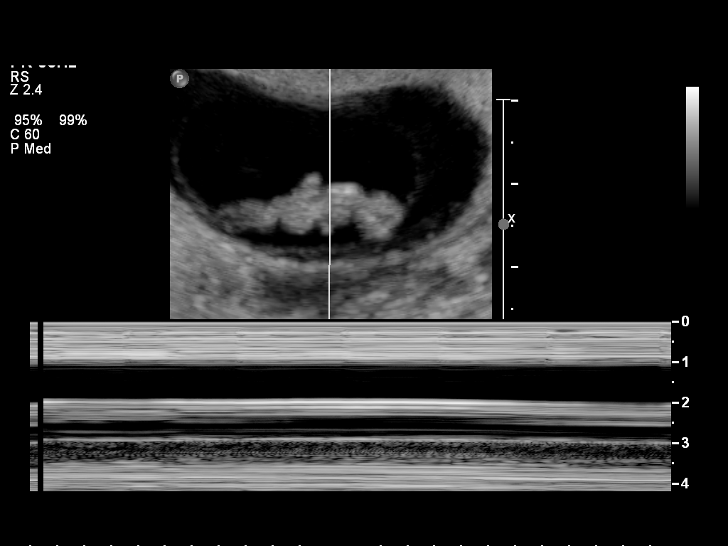
[im 52/52]
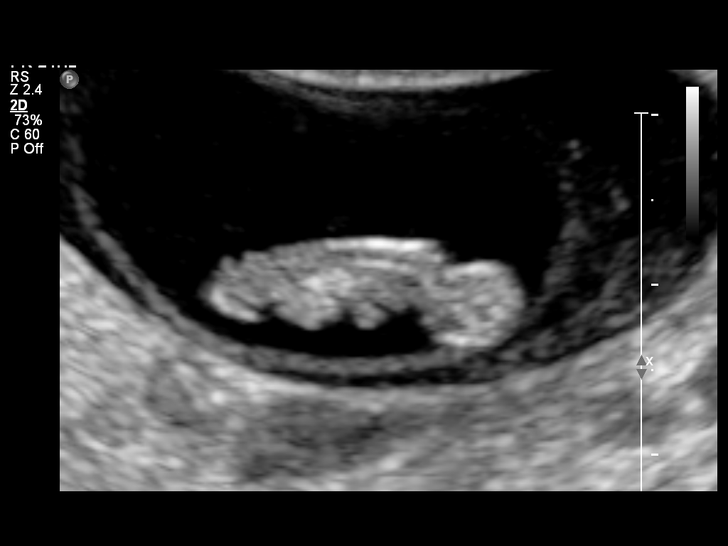

[14 of 28 positions shown; findings below may reference images not displayed]

OBSTETRICS REPORT
                      (Signed Final 06/10/2010 [DATE])

 Order#:         [id number]
                 98_O
Procedures

 US OB COMP LESS 14 WKS                                76801.0
 US OB TRANSVAGINAL                                    76817.0
Indications

 Pain - Abdominal/Pelvic
 Herpes simplex virus (TIGER)
Fetal Evaluation

 Preg. Location:    Intrauterine
 Gest. Sac:         Intrauterine
 Yolk Sac:          Visualized
 Fetal Pole:        Visualized
 Cardiac Activity:  Not visualized

 Comment:    Small subchorionic hemorrhage noted.
Biometry

 CRL:       18  mm     G. Age:  8w 1d                  EDD:    01/19/11
Gestational Age

 LMP:           10w 2d        Date:  03/30/10                 EDD:   01/04/11
 Best:          10w 2d     Det. By:  LMP  (03/30/10)          EDD:   01/04/11
Cervix Uterus Adnexa

 Left Ovary:    2.8 x 1.5 x 2.0 cm.  Small corpus luteum noted.
 Right Ovary:   3.1 x 1.7 x 1.8 cm.  Within normal limits.

 Adnexa:     No abnormality visualized.
Impression

 Failed IUP measuring 8w1d.
 Small subchorionic hemorrhage.

## 2012-11-18 IMAGING — US US OB COMP LESS 14 WK
1 series · 14 of 28 positions shown · non-contrast
Comparison: 06/10/2010

CLINICAL DATA: Pelvic pain.  Intrauterine fetal demise diagnosed at
8 weeks in June 2010.

OBSTETRIC <14 WK US AND TRANSVAGINAL OB US
TECHNIQUE: Both transabdominal and transvaginal ultrasound
examinations were performed for complete evaluation of the
gestation as well as the maternal uterus, adnexal regions, and
pelvic cul-de-sac.  Transvaginal technique was performed to assess
early pregnancy.

[Series 1: us ob comp less 14 wks · 14 of 61 slices shown]
[im 3/61]
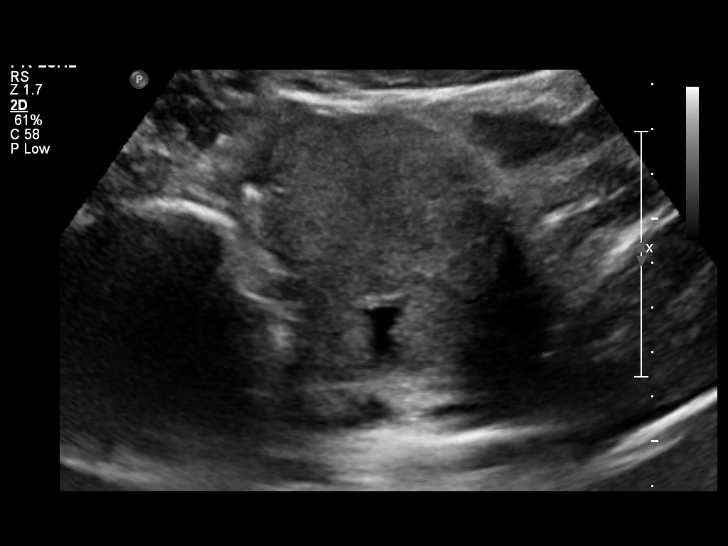
[im 7/61]
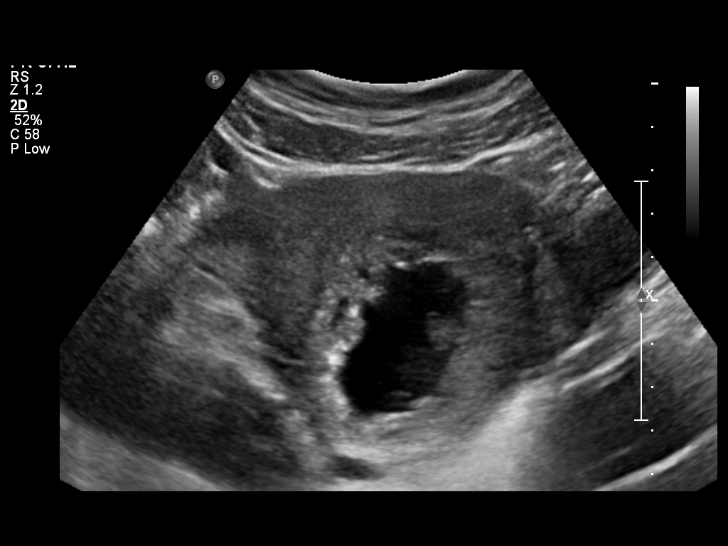
[im 12/61]
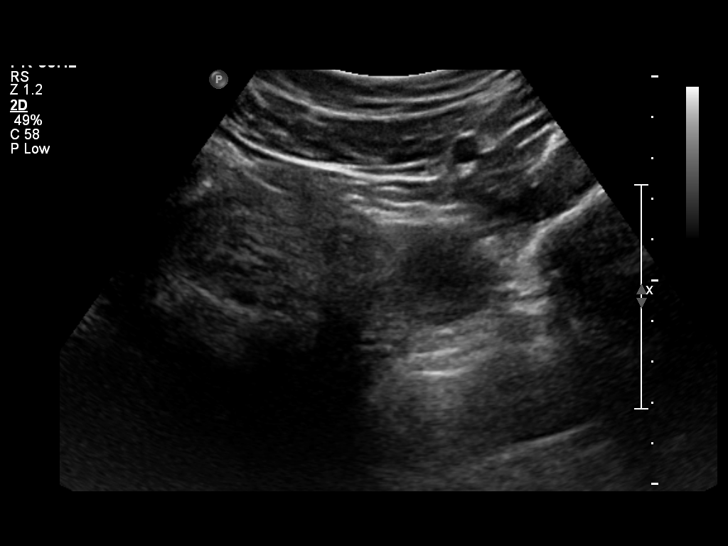
[im 16/61]
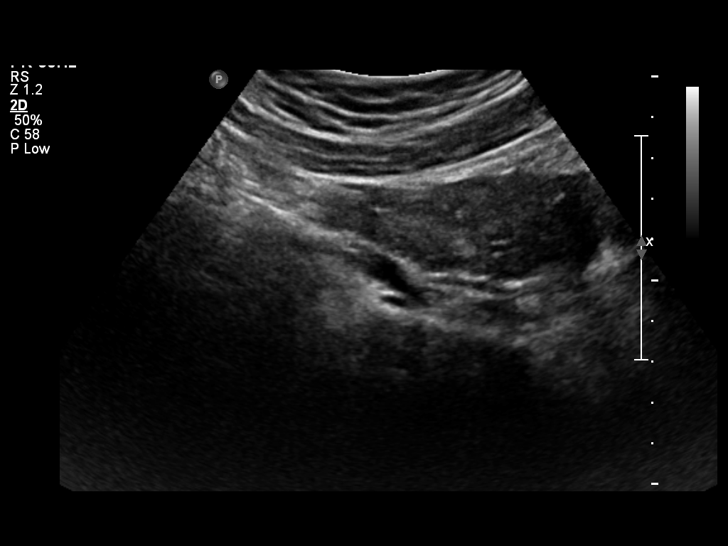
[im 21/61]
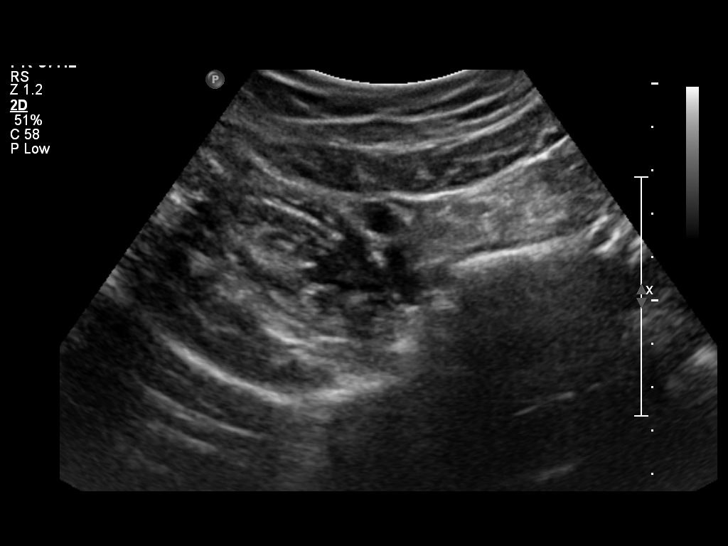
[im 25/61]
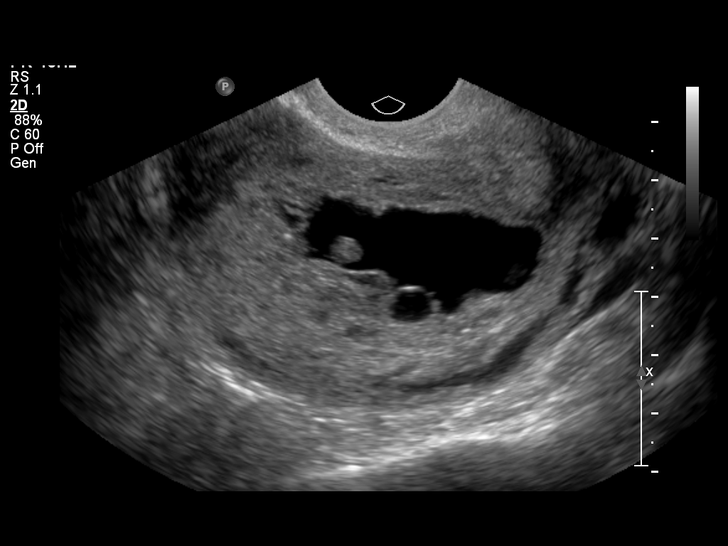
[im 29/61]
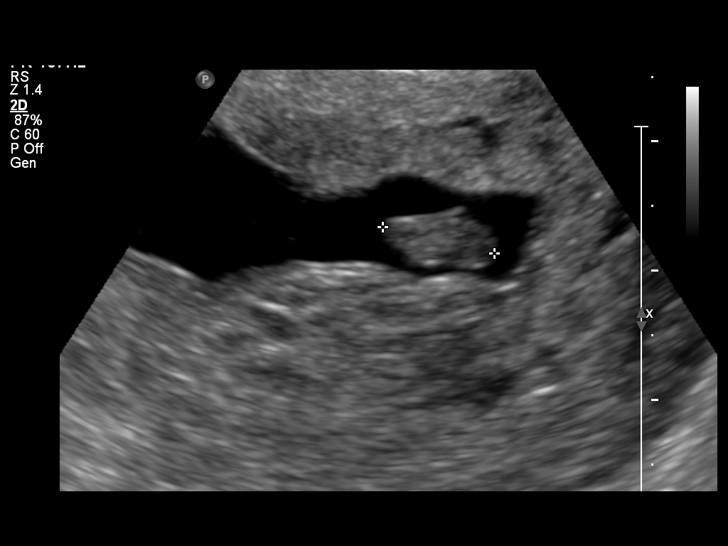
[im 34/61]
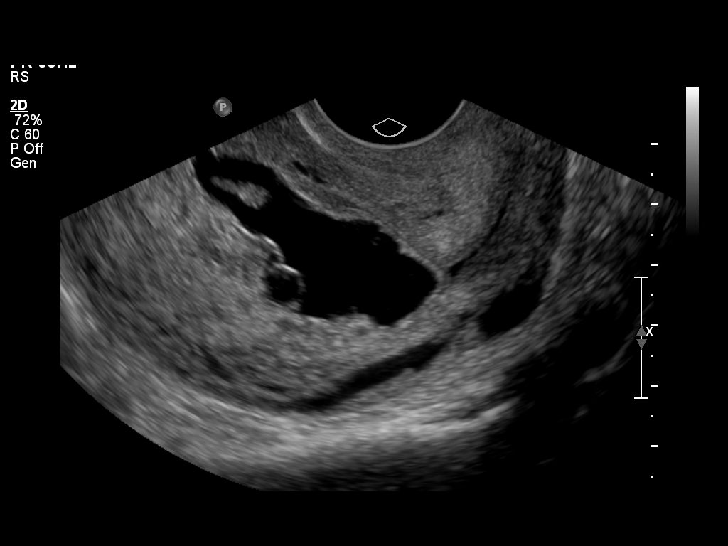
[im 38/61]
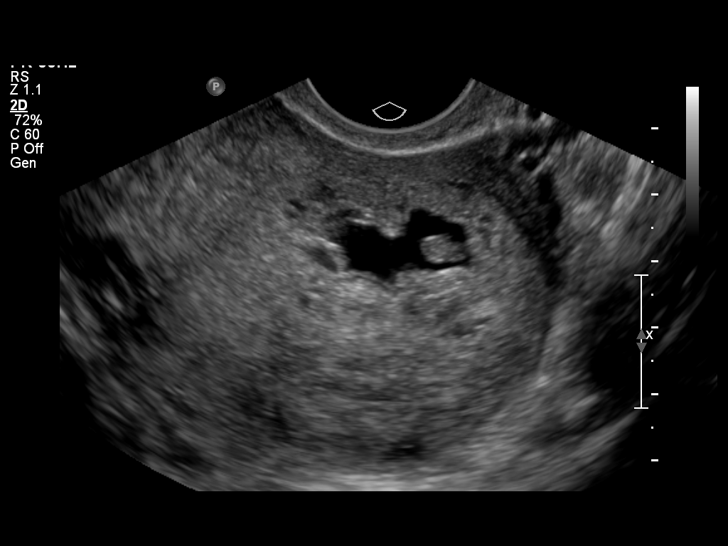
[im 43/61]
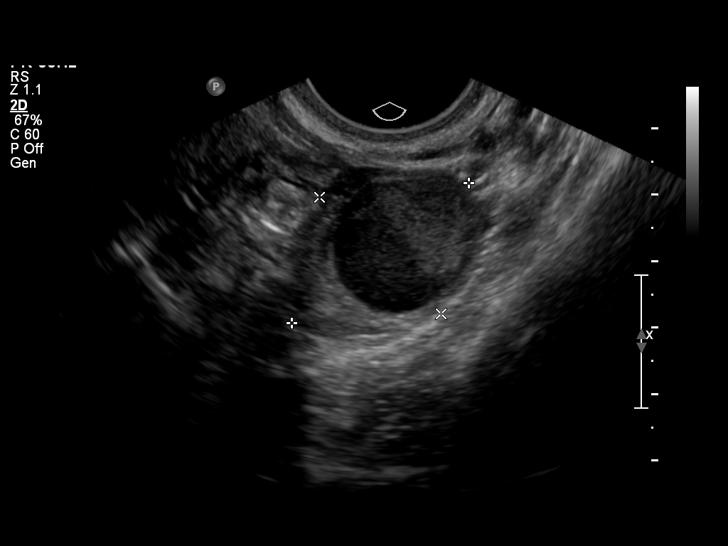
[im 47/61]
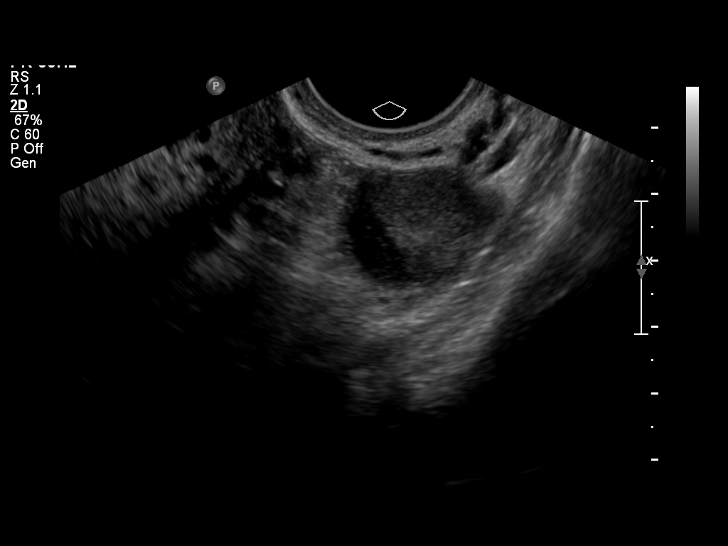
[im 52/61]
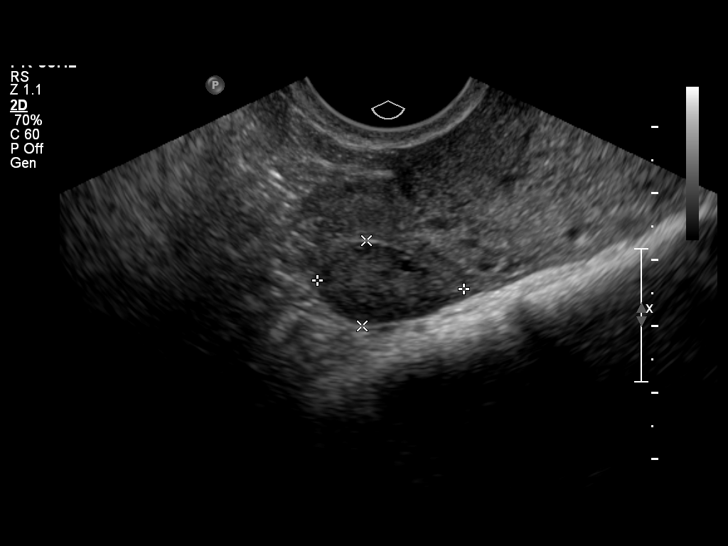
[im 56/61]
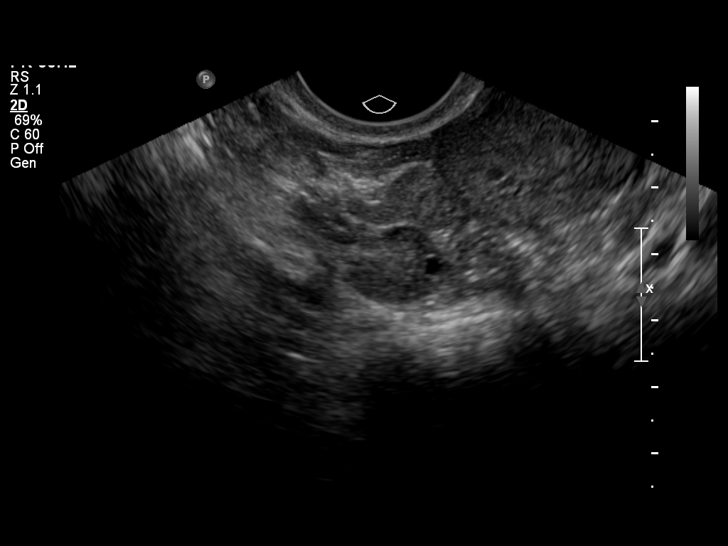
[im 61/61]
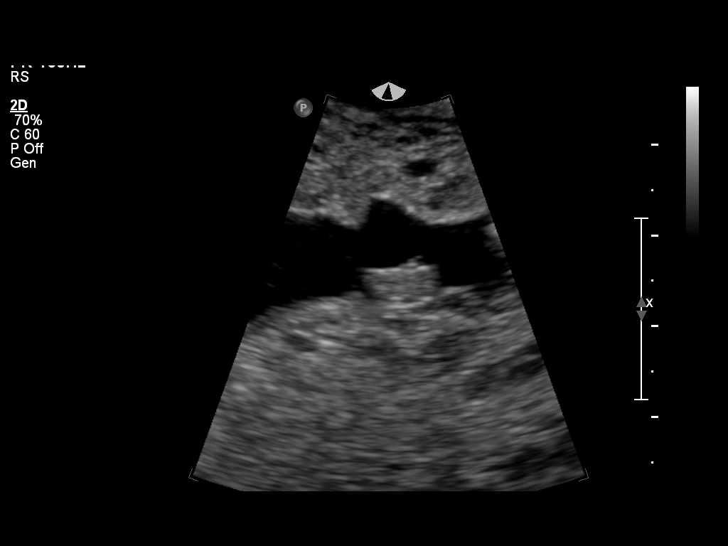

[14 of 28 positions shown; findings below may reference images not displayed]

Intrauterine gestational sac:  Visualized/normal in shape.
Yolk sac: None
Embryo: Present
Cardiac Activity: Absent
Heart Rate: Absent bpm

CRL: 0.88   mm  7   w  0   d        US EDC:

Maternal uterus/adnexae:
The ovaries have a normal appearance.  Left corpus luteum cyst is
identified.
IMPRESSION: Intrauterine fetal demise measuring 7 weeks 0 days.

## 2013-11-10 ENCOUNTER — Encounter (HOSPITAL_COMMUNITY): Payer: Self-pay | Admitting: Neonatology

## 2015-01-12 ENCOUNTER — Emergency Department (HOSPITAL_BASED_OUTPATIENT_CLINIC_OR_DEPARTMENT_OTHER)
Admission: EM | Admit: 2015-01-12 | Discharge: 2015-01-12 | Disposition: A | Discharge status: Home / Self Care | Payer: Medicaid Other | Attending: Emergency Medicine | Admitting: Emergency Medicine

## 2015-01-12 ENCOUNTER — Encounter (HOSPITAL_BASED_OUTPATIENT_CLINIC_OR_DEPARTMENT_OTHER): Payer: Self-pay | Admitting: *Deleted

## 2015-01-12 DIAGNOSIS — R102 Pelvic and perineal pain: Secondary | ICD-10-CM

## 2015-01-12 DIAGNOSIS — Z872 Personal history of diseases of the skin and subcutaneous tissue: Secondary | ICD-10-CM | POA: Insufficient documentation

## 2015-01-12 DIAGNOSIS — K219 Gastro-esophageal reflux disease without esophagitis: Secondary | ICD-10-CM | POA: Insufficient documentation

## 2015-01-12 DIAGNOSIS — Z8619 Personal history of other infectious and parasitic diseases: Secondary | ICD-10-CM | POA: Insufficient documentation

## 2015-01-12 DIAGNOSIS — Z3202 Encounter for pregnancy test, result negative: Secondary | ICD-10-CM | POA: Insufficient documentation

## 2015-01-12 DIAGNOSIS — Z87891 Personal history of nicotine dependence: Secondary | ICD-10-CM | POA: Insufficient documentation

## 2015-01-12 DIAGNOSIS — Z79899 Other long term (current) drug therapy: Secondary | ICD-10-CM | POA: Insufficient documentation

## 2015-01-12 DIAGNOSIS — B9689 Other specified bacterial agents as the cause of diseases classified elsewhere: Secondary | ICD-10-CM

## 2015-01-12 DIAGNOSIS — N760 Acute vaginitis: Secondary | ICD-10-CM | POA: Insufficient documentation

## 2015-01-12 DIAGNOSIS — I10 Essential (primary) hypertension: Secondary | ICD-10-CM | POA: Insufficient documentation

## 2015-01-12 LAB — URINALYSIS, ROUTINE W REFLEX MICROSCOPIC
BILIRUBIN URINE: NEGATIVE
Glucose, UA: NEGATIVE mg/dL
Hgb urine dipstick: NEGATIVE
KETONES UR: NEGATIVE mg/dL
Leukocytes, UA: NEGATIVE
NITRITE: NEGATIVE
PH: 6 (ref 5.0–8.0)
PROTEIN: NEGATIVE mg/dL
Specific Gravity, Urine: 1.021 (ref 1.005–1.030)

## 2015-01-12 LAB — WET PREP, GENITAL
Sperm: NONE SEEN
TRICH WET PREP: NONE SEEN
Yeast Wet Prep HPF POC: NONE SEEN

## 2015-01-12 LAB — PREGNANCY, URINE: PREG TEST UR: NEGATIVE

## 2015-01-12 MED ORDER — CEFTRIAXONE SODIUM 250 MG IJ SOLR
250.0000 mg | Freq: Once | INTRAMUSCULAR | Status: AC
  Administered 2015-01-12: 250 mg via INTRAMUSCULAR
  Filled 2015-01-12: qty 250

## 2015-01-12 MED ORDER — LIDOCAINE HCL (PF) 1 % IJ SOLN
INTRAMUSCULAR | Status: AC
  Administered 2015-01-12: 0.09 mL
  Filled 2015-01-12: qty 5

## 2015-01-12 MED ORDER — METRONIDAZOLE 500 MG PO TABS: 500.0000 mg | ORAL_TABLET | Freq: Two times a day (BID) | ORAL | Status: DC

## 2015-01-12 MED ORDER — AZITHROMYCIN 1 G PO PACK
1.0000 g | PACK | Freq: Once | ORAL | Status: AC
  Administered 2015-01-12: 1 g via ORAL
  Filled 2015-01-12: qty 1

## 2015-01-12 MED ORDER — IBUPROFEN 600 MG PO TABS: 600.0000 mg | ORAL_TABLET | Freq: Four times a day (QID) | ORAL | Status: DC | PRN

## 2015-01-12 NOTE — ED Notes (Signed)
Patient states she has a one week history of lower abdominal pain.  States this morning she has had two bm's , the first one was bloody and the second one was black.

## 2015-01-12 NOTE — ED Provider Notes (Signed)
CSN: [id number]     Arrival date & time 01/12/15  1224 History   First MD Initiated Contact with Patient 01/12/15 1618     Chief Complaint  Patient presents with  . Abdominal Pain     (Consider location/radiation/quality/duration/timing/severity/associated sxs/prior Treatment) HPI Patient reports approximately 1 week of diffuse lower abdominal pain that wraps around to her back. It is aching in quality. Initially she thought was musculoskeletal from additional activities and lifting around Christmas time. She reports that she had 2 bowel movements today and one looked kind of black and one looked kind of bloody. She also reports that she is having some vaginal discharge that looked kind of like goat cheese and was malodorous. She reports 5 days ago she had a fever to 101 but it has gone away now. She does not have known STD contact but feels it would be appropriate to check. She also had concern for possible pregnancy because her menstrual cycle last month was 3 days instead of the typical 5 days. Past Medical History  Diagnosis Date  . Genital herpes in women     8 months   . Trichomonas   . Abscess     underarms  . Reflux     diet controlled - no meds  . GERD (gastroesophageal reflux disease)     diet controlled - no meds  . Hypertension      no meds  . Hx of varicella   . Pregnancy induced hypertension   . Abnormal Pap smear    Past Surgical History  Procedure Laterality Date  . Dilation and curettage of uterus    . Eye surgery    . Dilation and evacuation  09/21/2010    Procedure: DILATATION AND EVACUATION (D&E);  Surgeon: Kathreen CosierBernard A Marshall, MD;  Location: [facility];  Service: Gynecology;  Laterality: N/A;   Family History  Problem Relation Age of Onset  . M[place]arriages / [place] Mother   . Hypertension Mother   . Hypertension Father   . Miscarriages / Stillbirths Maternal Aunt   . Crohn's disease Maternal Aunt   . Diabetes Maternal Aunt   . Hypertension Maternal  Grandmother   . Depression Maternal Grandmother   . Hypertension Maternal Grandfather   . Hypertension Paternal Grandmother   . Hypertension Paternal Grandfather   . Diabetes Maternal Uncle    Social History  Substance Use Topics  . Smoking status: Former Smoker -- 0.25 packs/day for 1 years    Types: Cigarettes    Quit date: 09/15/1998  . Smokeless tobacco: Never Used  . Alcohol Use: No   OB History    Gravida Para Term Preterm AB TAB SAB Ectopic Multiple Living   4 1 1  3 1 2   1      Review of Systems 10 Systems reviewed and are negative for acute change except as noted in the HPI.    Allergies  Review of patient's allergies indicates no known allergies.  Home Medications   Prior to Admission medications   Medication Sig Start Date End Date Taking? Authorizing Provider  calcium carbonate (TUMS - DOSED IN MG ELEMENTAL CALCIUM) 500 MG chewable tablet Chew 2 tablets by mouth daily as needed for heartburn.    Historical Provider, MD  ibuprofen (ADVIL,MOTRIN) 600 MG tablet Take 1 tablet (600 mg total) by mouth every 6 (six) hours as needed. 01/12/15   Arby BarretteMarcy Rilda Bulls, MD  metroNIDAZOLE (FLAGYL) 500 MG tablet Take 1 tablet (500 mg total) by mouth 2 (two)  times daily. One po bid x 7 days 01/12/15   Arby Barrette, MD  Prenatal Vit-Fe Fumarate-FA (PRENATAL MULTIVITAMIN) TABS Take 1 tablet by mouth daily.    Historical Provider, MD   BP 126/85 mmHg  Pulse 70  Temp(Src) 98.1 F (36.7 C) (Oral)  Resp 20  Ht 5\' 2"  (1.575 m)  Wt 175 lb (79.379 kg)  BMI 32.00 kg/m2  SpO2 100%  LMP 12/21/2014 (Exact Date) Physical Exam  Constitutional: She is oriented to person, place, and time. She appears well-developed and well-nourished.  HENT:  Head: Normocephalic and atraumatic.  Eyes: EOM are normal. Pupils are equal, round, and reactive to light.  Neck: Neck supple.  Cardiovascular: Normal rate, regular rhythm, normal heart sounds and intact distal pulses.   Pulmonary/Chest: Effort normal  and breath sounds normal.  Abdominal: Soft. Bowel sounds are normal. She exhibits no distension. There is no tenderness.  Genitourinary:  Normal external female genitalia. Moderate amount of whitish discharge in vaginal vault. No cervical friability. Bimanual examination, mild diffuse tenderness without uterine enlargement or adnexal mass.  Musculoskeletal: Normal range of motion. She exhibits no edema.  Neurological: She is alert and oriented to person, place, and time. She has normal strength. Coordination normal. GCS eye subscore is 4. GCS verbal subscore is 5. GCS motor subscore is 6.  Skin: Skin is warm, dry and intact.  Psychiatric: She has a normal mood and affect.    ED Course  Procedures (including critical care time) Labs Review Labs Reviewed  WET PREP, GENITAL - Abnormal; Notable for the following:    Clue Cells Wet Prep HPF POC PRESENT (*)    WBC, Wet Prep HPF POC MANY (*)    All other components within normal limits  URINALYSIS, ROUTINE W REFLEX MICROSCOPIC (NOT AT [facility]) - Abnormal; Notable for the following:    APPearance CLOUDY (*)    All other components within normal limits  PREGNANCY, URINE  GC/CHLAMYDIA PROBE AMP ([place]) NOT AT [facility]    Imaging Review No results found. I have personally reviewed and evaluated these images and lab results as part of my medical decision-making.   EKG Interpretation None      MDM   Final diagnoses:  Pelvic pain in female  Bacterial vaginosis   Patient presented with lower abdominal back/pelvic pain and low back pain. She is well in appearance and has a nonsurgical abdominal examination today. She does however endorse tenderness to general palpation of the pelvic organs. She reported fever 5 days ago. She also reports some concern for possible STD exposure. Patient is empirically treated and counseled to follow up on results before resuming sexual contact with her partner. She is otherwise alert and well in appearance.  She'll be given Flagyl for bacterial vaginosis with complaint of discharge that is malodorous.   Arby Barrette, MD 01/12/15 [phone]

## 2015-01-12 NOTE — Discharge Instructions (Signed)
Bacterial Vaginosis °Bacterial vaginosis is a vaginal infection that occurs when the normal balance of bacteria in the vagina is disrupted. It results from an overgrowth of certain bacteria. This is the most common vaginal infection in women of childbearing age. Treatment is important to prevent complications, especially in pregnant women, as it can cause a premature delivery. °CAUSES  °Bacterial vaginosis is caused by an increase in harmful bacteria that are normally present in smaller amounts in the vagina. Several different kinds of bacteria can cause bacterial vaginosis. However, the reason that the condition develops is not fully understood. °RISK FACTORS °Certain activities or behaviors can put you at an increased risk of developing bacterial vaginosis, including: °· Having a new sex partner or multiple sex partners. °· Douching. °· Using an intrauterine device (IUD) for contraception. °Women do not get bacterial vaginosis from toilet seats, bedding, swimming pools, or contact with objects around them. °SIGNS AND SYMPTOMS  °Some women with bacterial vaginosis have no signs or symptoms. Common symptoms include: °· Grey vaginal discharge. °· A fishlike odor with discharge, especially after sexual intercourse. °· Itching or burning of the vagina and vulva. °· Burning or pain with urination. °DIAGNOSIS  °Your health care provider will take a medical history and examine the vagina for signs of bacterial vaginosis. A sample of vaginal fluid may be taken. Your health care provider will look at this sample under a microscope to check for bacteria and abnormal cells. A vaginal pH test may also be done.  °TREATMENT  °Bacterial vaginosis may be treated with antibiotic medicines. These may be given in the form of a pill or a vaginal cream. A second round of antibiotics may be prescribed if the condition comes back after treatment. Because bacterial vaginosis increases your risk for sexually transmitted diseases, getting  treated can help reduce your risk for chlamydia, gonorrhea, HIV, and herpes. °HOME CARE INSTRUCTIONS  °· Only take over-the-counter or prescription medicines as directed by your health care provider. °· If antibiotic medicine was prescribed, take it as directed. Make sure you finish it even if you start to feel better. °· Tell all sexual partners that you have a vaginal infection. They should see their health care provider and be treated if they have problems, such as a mild rash or itching. °· During treatment, it is important that you follow these instructions: °¨ Avoid sexual activity or use condoms correctly. °¨ Do not douche. °¨ Avoid alcohol as directed by your health care provider. °¨ Avoid breastfeeding as directed by your health care provider. °SEEK MEDICAL CARE IF:  °· Your symptoms are not improving after 3 days of treatment. °· You have increased discharge or pain. °· You have a fever. °MAKE SURE YOU:  °· Understand these instructions. °· Will watch your condition. °· Will get help right away if you are not doing well or get worse. °FOR MORE INFORMATION  °Centers for Disease Control and Prevention, Division of STD Prevention: www.cdc.gov/std °American Sexual Health Association (ASHA): www.ashastd.org  °  °This information is not intended to replace advice given to you by your health care provider. Make sure you discuss any questions you have with your health care provider. °  °Document Released: 12/26/2004 Document Revised: 01/16/2014 Document Reviewed: 08/07/2012 °Elsevier Interactive Patient Education ©2016 Elsevier Inc. ° °Pelvic Pain, Female °Female pelvic pain can be caused by many different things and start from a variety of places. Pelvic pain refers to pain that is located in the lower half of the abdomen and   between your hips. The pain may occur over a short period of time (acute) or may be reoccurring (chronic). The cause of pelvic pain may be related to disorders affecting the female  reproductive organs (gynecologic), but it may also be related to the bladder, kidney stones, an intestinal complication, or muscle or skeletal problems. Getting help right away for pelvic pain is important, especially if there has been severe, sharp, or a sudden onset of unusual pain. It is also important to get help right away because some types of pelvic pain can be life threatening.  °CAUSES  °Below are only some of the causes of pelvic pain. The causes of pelvic pain can be in one of several categories.  °· Gynecologic. °¨ Pelvic inflammatory disease. °¨ Sexually transmitted infection. °¨ Ovarian cyst or a twisted ovarian ligament (ovarian torsion). °¨ Uterine lining that grows outside the uterus (endometriosis). °¨ Fibroids, cysts, or tumors. °¨ Ovulation. °· Pregnancy. °¨ Pregnancy that occurs outside the uterus (ectopic pregnancy). °¨ Miscarriage. °¨ Labor. °¨ Abruption of the placenta or ruptured uterus. °· Infection. °¨ Uterine infection (endometritis). °¨ Bladder infection. °¨ Diverticulitis. °¨ Miscarriage related to a uterine infection (septic abortion). °· Bladder. °¨ Inflammation of the bladder (cystitis). °¨ Kidney stone(s). °· Gastrointestinal. °¨ Constipation. °¨ Diverticulitis. °· Neurologic. °¨ Trauma. °¨ Feeling pelvic pain because of mental or emotional causes (psychosomatic). °· Cancers of the bowel or pelvis. °EVALUATION  °Your caregiver will want to take a careful history of your concerns. This includes recent changes in your health, a careful gynecologic history of your periods (menses), and a sexual history. Obtaining your family history and medical history is also important. Your caregiver may suggest a pelvic exam. A pelvic exam will help identify the location and severity of the pain. It also helps in the evaluation of which organ system may be involved. In order to identify the cause of the pelvic pain and be properly treated, your caregiver may order tests. These tests may include:   °· A pregnancy test. °· Pelvic ultrasonography. °· An X-ray exam of the abdomen. °· A urinalysis or evaluation of vaginal discharge. °· Blood tests. °HOME CARE INSTRUCTIONS  °· Only take over-the-counter or prescription medicines for pain, discomfort, or fever as directed by your caregiver.   °· Rest as directed by your caregiver.   °· Eat a balanced diet.   °· Drink enough fluids to make your urine clear or pale yellow, or as directed.   °· Avoid sexual intercourse if it causes pain.   °· Apply warm or cold compresses to the lower abdomen depending on which one helps the pain.   °· Avoid stressful situations.   °· Keep a journal of your pelvic pain. Write down when it started, where the pain is located, and if there are things that seem to be associated with the pain, such as food or your menstrual cycle. °· Follow up with your caregiver as directed.   °SEEK MEDICAL CARE IF: °· Your medicine does not help your pain. °· You have abnormal vaginal discharge. °SEEK IMMEDIATE MEDICAL CARE IF:  °· You have heavy bleeding from the vagina.   °· Your pelvic pain increases.   °· You feel light-headed or faint.   °· You have chills.   °· You have pain with urination or blood in your urine.   °· You have uncontrolled diarrhea or vomiting.   °· You have a fever or persistent symptoms for more than 3 days. °· You have a fever and your symptoms suddenly get worse.   °· You are being physically or   sexually abused. °  °This information is not intended to replace advice given to you by your health care provider. Make sure you discuss any questions you have with your health care provider. °  °Document Released: 11/23/2003 Document Revised: 09/16/2014 Document Reviewed: 04/17/2011 °Elsevier Interactive Patient Education ©2016 Elsevier Inc. ° °

## 2015-01-13 LAB — GC/CHLAMYDIA PROBE AMP (~~LOC~~) NOT AT ARMC
Chlamydia: NEGATIVE
NEISSERIA GONORRHEA: NEGATIVE

## 2015-09-23 ENCOUNTER — Emergency Department (HOSPITAL_COMMUNITY): Payer: No Typology Code available for payment source

## 2015-09-23 ENCOUNTER — Emergency Department (HOSPITAL_COMMUNITY)
Admission: EM | Admit: 2015-09-23 | Discharge: 2015-09-24 | Disposition: A | Discharge status: Home / Self Care | Payer: No Typology Code available for payment source | Attending: Emergency Medicine | Admitting: Emergency Medicine

## 2015-09-23 ENCOUNTER — Encounter (HOSPITAL_COMMUNITY): Payer: Self-pay | Admitting: *Deleted

## 2015-09-23 DIAGNOSIS — M25562 Pain in left knee: Secondary | ICD-10-CM

## 2015-09-23 DIAGNOSIS — Y92410 Unspecified street and highway as the place of occurrence of the external cause: Secondary | ICD-10-CM | POA: Diagnosis not present

## 2015-09-23 DIAGNOSIS — S0990XA Unspecified injury of head, initial encounter: Secondary | ICD-10-CM | POA: Diagnosis present

## 2015-09-23 DIAGNOSIS — Y9389 Activity, other specified: Secondary | ICD-10-CM | POA: Insufficient documentation

## 2015-09-23 DIAGNOSIS — S060X1A Concussion with loss of consciousness of 30 minutes or less, initial encounter: Secondary | ICD-10-CM

## 2015-09-23 DIAGNOSIS — Z87891 Personal history of nicotine dependence: Secondary | ICD-10-CM | POA: Diagnosis not present

## 2015-09-23 DIAGNOSIS — Y999 Unspecified external cause status: Secondary | ICD-10-CM | POA: Insufficient documentation

## 2015-09-23 DIAGNOSIS — I10 Essential (primary) hypertension: Secondary | ICD-10-CM | POA: Diagnosis not present

## 2015-09-23 MED ORDER — NAPROXEN 500 MG PO TABS
500.0000 mg | ORAL_TABLET | Freq: Once | ORAL | Status: AC
  Administered 2015-09-23: 500 mg via ORAL
  Filled 2015-09-23: qty 1

## 2015-09-23 MED ORDER — OXYCODONE-ACETAMINOPHEN 5-325 MG PO TABS
1.0000 | ORAL_TABLET | Freq: Once | ORAL | Status: AC
Start: 2015-09-23 — End: 2015-09-23
  Administered 2015-09-23: 1 via ORAL
  Filled 2015-09-23: qty 1
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
## 2015-09-24 MED ORDER — METHOCARBAMOL 500 MG PO TABS: 500.0000 mg | ORAL_TABLET | Freq: Two times a day (BID) | ORAL | 0 refills | Status: DC

## 2015-09-24 MED ORDER — HYDROCODONE-ACETAMINOPHEN 5-325 MG PO TABS: 1.0000 | ORAL_TABLET | ORAL | 0 refills | Status: DC | PRN

## 2015-09-24 MED ORDER — NAPROXEN 500 MG PO TABS: 500.0000 mg | ORAL_TABLET | Freq: Two times a day (BID) | ORAL | 0 refills | Status: DC

## 2016-10-01 ENCOUNTER — Inpatient Hospital Stay (HOSPITAL_COMMUNITY)
Admission: AD | Admit: 2016-10-01 | Discharge: 2016-10-01 | Disposition: A | Discharge status: Home / Self Care | Payer: Medicaid Other | Source: Ambulatory Visit | Attending: Obstetrics & Gynecology | Admitting: Obstetrics & Gynecology

## 2016-10-01 ENCOUNTER — Encounter (HOSPITAL_COMMUNITY): Payer: Self-pay | Admitting: *Deleted

## 2016-10-01 DIAGNOSIS — L0293 Carbuncle, unspecified: Secondary | ICD-10-CM | POA: Insufficient documentation

## 2016-10-01 DIAGNOSIS — W109XXA Fall (on) (from) unspecified stairs and steps, initial encounter: Secondary | ICD-10-CM | POA: Diagnosis not present

## 2016-10-01 DIAGNOSIS — Z3A11 11 weeks gestation of pregnancy: Secondary | ICD-10-CM | POA: Insufficient documentation

## 2016-10-01 DIAGNOSIS — O26891 Other specified pregnancy related conditions, first trimester: Secondary | ICD-10-CM | POA: Insufficient documentation

## 2016-10-01 DIAGNOSIS — Z87891 Personal history of nicotine dependence: Secondary | ICD-10-CM | POA: Diagnosis not present

## 2016-10-01 DIAGNOSIS — L02234 Carbuncle of groin: Secondary | ICD-10-CM

## 2016-10-01 MED ORDER — OXYCODONE-ACETAMINOPHEN 5-325 MG PO TABS
1.0000 | ORAL_TABLET | Freq: Once | ORAL | Status: AC
  Administered 2016-10-01: 1 via ORAL
  Filled 2016-10-01: qty 1

## 2016-10-01 MED ORDER — OXYCODONE-ACETAMINOPHEN 5-325 MG PO TABS: 1.0000 | ORAL_TABLET | Freq: Four times a day (QID) | ORAL | 0 refills | Status: DC | PRN

## 2016-10-01 MED ORDER — CEPHALEXIN 500 MG PO CAPS
500.0000 mg | ORAL_CAPSULE | Freq: Once | ORAL | Status: AC
  Administered 2016-10-01: 500 mg via ORAL
  Filled 2016-10-01: qty 1

## 2016-10-01 MED ORDER — CEPHALEXIN 500 MG PO CAPS: 500.0000 mg | ORAL_CAPSULE | Freq: Four times a day (QID) | ORAL | 0 refills | Status: DC

## 2016-10-01 NOTE — Progress Notes (Signed)
Written and verbal d/c instructions given by Rockwell Germany RN

## 2016-10-01 NOTE — MAU Note (Signed)
Slipped down few steps Monday and bumped down few steps on my butt. Have been ok since then. Now have boil on L inner thigh near groin and really hurts. No vag bleeding or d/c
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
## 2016-10-06 ENCOUNTER — Telehealth: Payer: Self-pay | Admitting: General Practice

## 2016-10-06 ENCOUNTER — Other Ambulatory Visit: Payer: Self-pay | Admitting: General Practice

## 2016-10-06 NOTE — Telephone Encounter (Signed)
-----   Message from Aviva Signs, CNM sent at 10/01/2016  4:53 AM EDT ----- Regarding: referral to gen surg Has LARGE boil on left groin, seen in MAU  Needs to see surgery walkin clinic if possible early week  I told her to go to ED if it gets worse before then I put her on antibiotic and a few Percocet  She has not started care yet  Thanks Hilda Lias

## 2016-10-06 NOTE — Telephone Encounter (Signed)
[facility] and reviewed patient's chart. They state they do not see abscesses greater than 5cm in offifce. That would be a direct admit or ER visit. They recommended since patient hasn't been seen since 9/23, she be reevaluated by [place] before a referral is made. Called patient to see how she is doing and if abscess has reduced in size, no answer- left message to call us back.

## 2016-10-17 NOTE — Telephone Encounter (Signed)
Attempted to call patient again. No answer. Voice mail left stating I am calling to check in how she is doing since her MAU visit, also to see if she would like to make an appt to start prenatal care. Asked that she return my call.

## 2016-10-17 NOTE — Telephone Encounter (Signed)
Spoke to pt and she stated that the boil has cleared up with the use of the antibiotic and that she was not longer having any swelling or pain

## 2016-10-25 LAB — OB RESULTS CONSOLE GC/CHLAMYDIA
Chlamydia: NEGATIVE
GC PROBE AMP, GENITAL: NEGATIVE

## 2016-10-25 LAB — OB RESULTS CONSOLE ABO/RH: RH Type: POSITIVE

## 2016-10-25 LAB — OB RESULTS CONSOLE RPR: RPR: NONREACTIVE

## 2016-10-25 LAB — OB RESULTS CONSOLE HIV ANTIBODY (ROUTINE TESTING): HIV: NONREACTIVE

## 2016-10-25 LAB — OB RESULTS CONSOLE HEPATITIS B SURFACE ANTIGEN: Hepatitis B Surface Ag: NEGATIVE

## 2016-10-25 LAB — OB RESULTS CONSOLE ANTIBODY SCREEN: Antibody Screen: NEGATIVE

## 2016-10-25 LAB — OB RESULTS CONSOLE RUBELLA ANTIBODY, IGM: Rubella: IMMUNE

## 2016-11-03 ENCOUNTER — Encounter: Payer: Self-pay | Admitting: Medical

## 2016-11-20 ENCOUNTER — Encounter: Payer: Medicaid Other | Admitting: Medical

## 2017-01-09 NOTE — L&D Delivery Note (Signed)
Delivery Note Pt complete with uncontrollable urge to push. She pushed twice and at 4:22 PM a viable female was delivered via  (Presentation: ;  ROT).  APGAR:7,9 , ; weight  pending.   Placenta status:deliveered intact, schultz , .  Cord: 3vc with the following complications:none .  Cord pH: n/a  Anesthesia:  Epidural Episiotomy:  none Lacerations:  none Est. Blood Loss (mL):  350ml  Mom to postpartum.  Baby to Couplet care / Skin to Skin.  Cathrine MusterCecilia W Banga 04/17/2017, 4:30 PM

## 2017-02-23 ENCOUNTER — Other Ambulatory Visit: Payer: Self-pay

## 2017-02-23 ENCOUNTER — Encounter (HOSPITAL_COMMUNITY): Payer: Self-pay | Admitting: *Deleted

## 2017-02-23 ENCOUNTER — Inpatient Hospital Stay (HOSPITAL_COMMUNITY)
Admission: AD | Admit: 2017-02-23 | Discharge: 2017-02-24 | Disposition: A | Discharge status: Home / Self Care | Payer: Medicaid Other | Source: Ambulatory Visit | Attending: Obstetrics and Gynecology | Admitting: Obstetrics and Gynecology

## 2017-02-23 DIAGNOSIS — Z8379 Family history of other diseases of the digestive system: Secondary | ICD-10-CM | POA: Diagnosis not present

## 2017-02-23 DIAGNOSIS — O163 Unspecified maternal hypertension, third trimester: Secondary | ICD-10-CM | POA: Diagnosis not present

## 2017-02-23 DIAGNOSIS — O368130 Decreased fetal movements, third trimester, not applicable or unspecified: Secondary | ICD-10-CM

## 2017-02-23 DIAGNOSIS — Z3A31 31 weeks gestation of pregnancy: Secondary | ICD-10-CM | POA: Diagnosis not present

## 2017-02-23 DIAGNOSIS — Z9889 Other specified postprocedural states: Secondary | ICD-10-CM | POA: Insufficient documentation

## 2017-02-23 DIAGNOSIS — Z833 Family history of diabetes mellitus: Secondary | ICD-10-CM | POA: Diagnosis not present

## 2017-02-23 DIAGNOSIS — Z818 Family history of other mental and behavioral disorders: Secondary | ICD-10-CM | POA: Insufficient documentation

## 2017-02-23 DIAGNOSIS — Z79899 Other long term (current) drug therapy: Secondary | ICD-10-CM | POA: Insufficient documentation

## 2017-02-23 DIAGNOSIS — O26893 Other specified pregnancy related conditions, third trimester: Secondary | ICD-10-CM | POA: Diagnosis not present

## 2017-02-23 DIAGNOSIS — Z87891 Personal history of nicotine dependence: Secondary | ICD-10-CM | POA: Diagnosis not present

## 2017-02-23 DIAGNOSIS — Z8249 Family history of ischemic heart disease and other diseases of the circulatory system: Secondary | ICD-10-CM | POA: Insufficient documentation

## 2017-02-23 DIAGNOSIS — R109 Unspecified abdominal pain: Secondary | ICD-10-CM | POA: Insufficient documentation

## 2017-02-23 DIAGNOSIS — O99613 Diseases of the digestive system complicating pregnancy, third trimester: Secondary | ICD-10-CM | POA: Insufficient documentation

## 2017-02-23 DIAGNOSIS — K219 Gastro-esophageal reflux disease without esophagitis: Secondary | ICD-10-CM | POA: Diagnosis not present

## 2017-02-23 DIAGNOSIS — O4703 False labor before 37 completed weeks of gestation, third trimester: Secondary | ICD-10-CM | POA: Diagnosis not present

## 2017-02-23 LAB — URINALYSIS, ROUTINE W REFLEX MICROSCOPIC
Bilirubin Urine: NEGATIVE
GLUCOSE, UA: NEGATIVE mg/dL
Ketones, ur: NEGATIVE mg/dL
Nitrite: NEGATIVE
PH: 6 (ref 5.0–8.0)
PROTEIN: NEGATIVE mg/dL
Specific Gravity, Urine: 1.015 (ref 1.005–1.030)

## 2017-02-23 NOTE — MAU Provider Note (Signed)
Chief Complaint:  Abdominal Pain and Decreased Fetal Movement   First Provider Initiated Contact with Patient 02/23/17 2322     HPI: Amber Duran is a 32 y.o. G5P1031 at 8231w5dwho presents to maternity admissions reporting uterine cramps off and on.  Also has had decreased Fetal movement today, though feels it now. . She denies LOF, vaginal bleeding, vaginal itching/burning, urinary symptoms, h/a, dizziness, n/v, diarrhea, constipation or fever/chills.    Has been on Makena since 16 weeks due to having had 3 SABs.  Has never had a fetal fibronectin  Abdominal Pain  This is a new problem. The current episode started today. The onset quality is gradual. The problem occurs intermittently. The pain is mild. The quality of the pain is cramping. The abdominal pain does not radiate. Pertinent negatives include no constipation, diarrhea, dysuria, fever, headaches, myalgias, nausea or vomiting. Nothing aggravates the pain. The pain is relieved by nothing. She has tried nothing for the symptoms.    RN Note: Having period-like cramping for an hour. Decreased FM today. Baby moving in Triage. Denies vag bleeding or d/c    Past Medical History: Past Medical History:  Diagnosis Date  . Abnormal Pap smear   . Abscess    underarms  . Genital herpes in women    8 months   . GERD (gastroesophageal reflux disease)    diet controlled - no meds  . Hx of varicella   . Hypertension     no meds  . Pregnancy induced hypertension   . Reflux    diet controlled - no meds  . Trichomonas     Past obstetric history: OB History  Gravida Para Term Preterm AB Living  5 1 1   3 1   SAB TAB Ectopic Multiple Live Births  2 1     1     # Outcome Date GA Lbr Len/2nd Weight Sex Delivery Anes PTL Lv  5 Current           4 Term 04/06/12 8149w4d / 01:18 9 lb 0.6 oz (4.1 kg) M Vag-Vacuum EPI  LIV     Birth Comments: None  3 SAB 2010 3970w0d         2 SAB 2008 68619w0d            Birth Comments: twin  1 TAB 2005 67619w0d              Past Surgical History: Past Surgical History:  Procedure Laterality Date  . DILATION AND CURETTAGE OF UTERUS    . DILATION AND EVACUATION  09/21/2010   Procedure: DILATATION AND EVACUATION (D&E);  Surgeon: Kathreen CosierBernard A Marshall, MD;  Location: [facility];  Service: Gynecology;  Laterality: N/A;  . EYE SURGERY      Family History: Family History  Problem Relation Age of Onset  . Miscarriages / [place] Mother   . Hypertension Mother   . Hypertension Father   . Miscarriages / Stillbirths Maternal Aunt   . Crohn's disease Maternal Aunt   . Diabetes Maternal Aunt   . Hypertension Maternal Grandmother   . Depression Maternal Grandmother   . Hypertension Maternal Grandfather   . Hypertension Paternal Grandmother   . Hypertension Paternal Grandfather   . Diabetes Maternal Uncle     Social History: Social History   Tobacco Use  . Smoking status: Former Smoker    Packs/day: 0.25    Years: 1.00    Pack years: 0.25    Types: Cigarettes    Last attempt  to quit: 09/15/1998    Years since quitting: 18.4  . Smokeless tobacco: Never Used  Substance Use Topics  . Alcohol use: No  . Drug use: No    Allergies: No Known Allergies  Meds:  Medications Prior to Admission  Medication Sig Dispense Refill Last Dose  . cephALEXin (KEFLEX) 500 MG capsule Take 1 capsule (500 mg total) by mouth 4 (four) times daily. 28 capsule 0   . oxyCODONE-acetaminophen (PERCOCET/ROXICET) 5-325 MG tablet Take 1-2 tablets by mouth every 6 (six) hours as needed. 20 tablet 0   . Prenatal Vit-Fe Fumarate-FA (PRENATAL MULTIVITAMIN) TABS tablet Take 1 tablet by mouth daily at 12 noon.   02/23/2017 at 0900    I have reviewed patient's Past Medical Hx, Surgical Hx, Family Hx, Social Hx, medications and allergies.   ROS:  Review of Systems  Constitutional: Negative for fever.  Gastrointestinal: Positive for abdominal pain. Negative for constipation, diarrhea, nausea and vomiting.  Genitourinary:  Negative for dysuria.  Musculoskeletal: Negative for myalgias.  Neurological: Negative for headaches.   Other systems negative  Physical Exam   Patient Vitals for the past 24 hrs:  BP Temp Pulse Height Weight  02/23/17 2228 119/65 98.4 F (36.9 C) 95 5\' 2"  (1.575 m) 177 lb (80.3 kg)   Constitutional: Well-developed, well-nourished female in no acute distress.  Cardiovascular: normal rate and rhythm Respiratory: normal effort, clear to auscultation bilaterally GI: Abd soft, non-tender, gravid appropriate for gestational age.   No rebound or guarding. MS: Extremities nontender, no edema, normal ROM Neurologic: Alert and oriented x 4.  GU: Neg CVAT.  PELVIC EXAM:     Cervix closed/long/-3/soft   FHT:  Baseline 135 , moderate variability, accelerations present, no decelerations Contractions: q 8 mins Irregular     Labs: Results for orders placed or performed during the hospital encounter of 02/23/17 (from the past 24 hour(s))  Urinalysis, Routine w reflex microscopic     Status: Abnormal   Collection Time: 02/23/17 10:35 PM  Result Value Ref Range   Color, Urine YELLOW YELLOW   APPearance HAZY (A) CLEAR   Specific Gravity, Urine 1.015 1.005 - 1.030   pH 6.0 5.0 - 8.0   Glucose, UA NEGATIVE NEGATIVE mg/dL   Hgb urine dipstick SMALL (A) NEGATIVE   Bilirubin Urine NEGATIVE NEGATIVE   Ketones, ur NEGATIVE NEGATIVE mg/dL   Protein, ur NEGATIVE NEGATIVE mg/dL   Nitrite NEGATIVE NEGATIVE   Leukocytes, UA SMALL (A) NEGATIVE   RBC / HPF 0-5 0 - 5 RBC/hpf   WBC, UA 6-30 0 - 5 WBC/hpf   Bacteria, UA RARE (A) NONE SEEN   Squamous Epithelial / LPF 6-30 (A) NONE SEEN   Mucus PRESENT   Fetal fibronectin     Status: None   Collection Time: 02/23/17 11:26 PM  Result Value Ref Range   Fetal Fibronectin NEGATIVE NEGATIVE       Imaging:  No results found.  MAU Course/MDM: I have ordered labs and reviewed results. Fetal fibronectin done >> resulted as negative NST reviewed and  found reactive Consult Dr Hinton Rao with presentation, exam findings and test results.  Treatments in MAU included EFM, Tylenol for headache, PO hydration.   Uterine contractions resolved over time.  Felt better after Tylenol. Reviewed PTL signs to return for  Assessment: [facility] at [redacted]w[redacted]d Preterm contractions with no change in cervix and negative Fetal Fibronectin Decreased fetal movement, resolved with reassuring FHR pattern  Plan: Discharge home Preterm Labor precautions and fetal kick counts Follow  up in Office for prenatal visits and recheck of status  Encouraged to return here or to other Urgent Care/ED if she develops worsening of symptoms, increase in pain, fever, or other concerning symptoms.   Pt stable at time of discharge.  Wynelle Bourgeois CNM, MSN Certified Nurse-Midwife 02/23/2017 11:23 PM

## 2017-02-23 NOTE — MAU Note (Signed)
Having period-like cramping for an hour. Decreased FM today. Baby moving in Triage. Denies vag bleeding or d/c

## 2017-02-24 LAB — FETAL FIBRONECTIN: Fetal Fibronectin: NEGATIVE

## 2017-02-24 MED ORDER — ACETAMINOPHEN 325 MG PO TABS
650.0000 mg | ORAL_TABLET | Freq: Four times a day (QID) | ORAL | Status: DC | PRN
  Administered 2017-02-24: 650 mg via ORAL
  Filled 2017-02-24: qty 2

## 2017-02-24 NOTE — Discharge Instructions (Signed)
Braxton Hicks Contractions °Contractions of the uterus can occur throughout pregnancy, but they are not always a sign that you are in labor. You may have practice contractions called Braxton Hicks contractions. These false labor contractions are sometimes confused with true labor. °What are Braxton Hicks contractions? °Braxton Hicks contractions are tightening movements that occur in the muscles of the uterus before labor. Unlike true labor contractions, these contractions do not result in opening (dilation) and thinning of the cervix. Toward the end of pregnancy (32-34 weeks), Braxton Hicks contractions can happen more often and may become stronger. These contractions are sometimes difficult to tell apart from true labor because they can be very uncomfortable. You should not feel embarrassed if you go to the hospital with false labor. °Sometimes, the only way to tell if you are in true labor is for your health care provider to look for changes in the cervix. The health care provider will do a physical exam and may monitor your contractions. If you are not in true labor, the exam should show that your cervix is not dilating and your water has not broken. °If there are other health problems associated with your pregnancy, it is completely safe for you to be sent home with false labor. You may continue to have Braxton Hicks contractions until you go into true labor. °How to tell the difference between true labor and false labor °True labor °· Contractions last 30-70 seconds. °· Contractions become very regular. °· Discomfort is usually felt in the top of the uterus, and it spreads to the lower abdomen and low back. °· Contractions do not go away with walking. °· Contractions usually become more intense and increase in frequency. °· The cervix dilates and gets thinner. °False labor °· Contractions are usually shorter and not as strong as true labor contractions. °· Contractions are usually irregular. °· Contractions  are often felt in the front of the lower abdomen and in the groin. °· Contractions may go away when you walk around or change positions while lying down. °· Contractions get weaker and are shorter-lasting as time goes on. °· The cervix usually does not dilate or become thin. °Follow these instructions at home: °· Take over-the-counter and prescription medicines only as told by your health care provider. °· Keep up with your usual exercises and follow other instructions from your health care provider. °· Eat and drink lightly if you think you are going into labor. °· If Braxton Hicks contractions are making you uncomfortable: °? Change your position from lying down or resting to walking, or change from walking to resting. °? Sit and rest in a tub of warm water. °? Drink enough fluid to keep your urine pale yellow. Dehydration may cause these contractions. °? Do slow and deep breathing several times an hour. °· Keep all follow-up prenatal visits as told by your health care provider. This is important. °Contact a health care provider if: °· You have a fever. °· You have continuous pain in your abdomen. °Get help right away if: °· Your contractions become stronger, more regular, and closer together. °· You have fluid leaking or gushing from your vagina. °· You pass blood-tinged mucus (bloody show). °· You have bleeding from your vagina. °· You have low back pain that you never had before. °· You feel your baby’s head pushing down and causing pelvic pressure. °· Your baby is not moving inside you as much as it used to. °Summary °· Contractions that occur before labor are called Braxton   Hicks contractions, false labor, or practice contractions. °· Braxton Hicks contractions are usually shorter, weaker, farther apart, and less regular than true labor contractions. True labor contractions usually become progressively stronger and regular and they become more frequent. °· Manage discomfort from Braxton Hicks contractions by  changing position, resting in a warm bath, drinking plenty of water, or practicing deep breathing. °This information is not intended to replace advice given to you by your health care provider. Make sure you discuss any questions you have with your health care provider. °Document Released: 05/11/2016 Document Revised: 05/11/2016 Document Reviewed: 05/11/2016 °Elsevier Interactive Patient Education © 2018 [employer] ° °Fetal Movement Counts °Patient Name: ________________________________________________ Patient Due Date: ____________________ °What is a fetal movement count? °A fetal movement count is the number of times that you feel your baby move during a certain amount of time. This may also be called a fetal kick count. A fetal movement count is recommended for every pregnant woman. You may be asked to start counting fetal movements as early as week 28 of your pregnancy. °Pay attention to when your baby is most active. You may notice your baby's sleep and wake cycles. You may also notice things that make your baby move more. You should do a fetal movement count: °· When your baby is normally most active. °· At the same time each day. ° °A good time to count movements is while you are resting, after having something to eat and drink. °How do I count fetal movements? °1. Find a quiet, comfortable area. Sit, or lie down on your side. °2. Write down the date, the start time and stop time, and the number of movements that you felt between those two times. Take this information with you to your health care visits. °3. For 2 hours, count kicks, flutters, swishes, rolls, and jabs. You should feel at least 10 movements during 2 hours. °4. You may stop counting after you have felt 10 movements. °5. If you do not feel 10 movements in 2 hours, have something to eat and drink. Then, keep resting and counting for 1 hour. If you feel at least 4 movements during that hour, you may stop counting. °Contact a health care  provider if: °· You feel fewer than 4 movements in 2 hours. °· Your baby is not moving like he or she usually does. °Date: ____________ Start time: ____________ Stop time: ____________ Movements: ____________ °Date: ____________ Start time: ____________ Stop time: ____________ Movements: ____________ °Date: ____________ Start time: ____________ Stop time: ____________ Movements: ____________ °Date: ____________ Start time: ____________ Stop time: ____________ Movements: ____________ °Date: ____________ Start time: ____________ Stop time: ____________ Movements: ____________ °Date: ____________ Start time: ____________ Stop time: ____________ Movements: ____________ °Date: ____________ Start time: ____________ Stop time: ____________ Movements: ____________ °Date: ____________ Start time: ____________ Stop time: ____________ Movements: ____________ °Date: ____________ Start time: ____________ Stop time: ____________ Movements: ____________ °This information is not intended to replace advice given to you by your health care provider. Make sure you discuss any questions you have with your health care provider. °Document Released: 01/25/2006 Document Revised: 08/25/2015 Document Reviewed: 02/04/2015 °Elsevier Interactive Patient Education © 2018 [employer] ° °

## 2017-03-16 LAB — OB RESULTS CONSOLE GBS: STREP GROUP B AG: NEGATIVE

## 2017-04-09 ENCOUNTER — Encounter (HOSPITAL_COMMUNITY): Payer: Self-pay | Admitting: *Deleted

## 2017-04-09 ENCOUNTER — Telehealth (HOSPITAL_COMMUNITY): Payer: Self-pay | Admitting: *Deleted

## 2017-04-09 NOTE — Telephone Encounter (Signed)
Preadmission screen  

## 2017-04-10 ENCOUNTER — Inpatient Hospital Stay (HOSPITAL_COMMUNITY)
Admission: AD | Admit: 2017-04-10 | Payer: Medicaid Other | Source: Ambulatory Visit | Admitting: Obstetrics and Gynecology

## 2017-04-17 ENCOUNTER — Inpatient Hospital Stay (HOSPITAL_COMMUNITY)
Admission: RE | Admit: 2017-04-17 | Discharge: 2017-04-19 | DRG: 806 | Disposition: A | Discharge status: Home / Self Care | Payer: BLUE CROSS/BLUE SHIELD | Source: Ambulatory Visit | Attending: Obstetrics and Gynecology | Admitting: Obstetrics and Gynecology

## 2017-04-17 ENCOUNTER — Other Ambulatory Visit: Payer: Self-pay

## 2017-04-17 ENCOUNTER — Encounter (HOSPITAL_COMMUNITY): Payer: Self-pay

## 2017-04-17 ENCOUNTER — Inpatient Hospital Stay (HOSPITAL_COMMUNITY): Payer: BLUE CROSS/BLUE SHIELD | Admitting: Anesthesiology

## 2017-04-17 DIAGNOSIS — O9832 Other infections with a predominantly sexual mode of transmission complicating childbirth: Secondary | ICD-10-CM | POA: Diagnosis present

## 2017-04-17 DIAGNOSIS — A6000 Herpesviral infection of urogenital system, unspecified: Secondary | ICD-10-CM | POA: Diagnosis present

## 2017-04-17 DIAGNOSIS — Z87891 Personal history of nicotine dependence: Secondary | ICD-10-CM

## 2017-04-17 DIAGNOSIS — O480 Post-term pregnancy: Principal | ICD-10-CM

## 2017-04-17 DIAGNOSIS — Z3A41 41 weeks gestation of pregnancy: Secondary | ICD-10-CM | POA: Diagnosis not present

## 2017-04-17 LAB — CBC
HEMATOCRIT: 40 % (ref 36.0–46.0)
Hemoglobin: 13.7 g/dL (ref 12.0–15.0)
MCH: 29.2 pg (ref 26.0–34.0)
MCHC: 34.3 g/dL (ref 30.0–36.0)
MCV: 85.3 fL (ref 78.0–100.0)
PLATELETS: 173 10*3/uL (ref 150–400)
RBC: 4.69 MIL/uL (ref 3.87–5.11)
RDW: 15.1 % (ref 11.5–15.5)
WBC: 11 10*3/uL — ABNORMAL HIGH (ref 4.0–10.5)

## 2017-04-17 LAB — TYPE AND SCREEN
ABO/RH(D): B POS
Antibody Screen: NEGATIVE

## 2017-04-17 MED ORDER — PRENATAL MULTIVITAMIN CH
1.0000 | ORAL_TABLET | Freq: Every day | ORAL | Status: DC
  Administered 2017-04-18: 1 via ORAL
  Filled 2017-04-17: qty 1

## 2017-04-17 MED ORDER — IBUPROFEN 600 MG PO TABS
600.0000 mg | ORAL_TABLET | Freq: Four times a day (QID) | ORAL | Status: DC
  Administered 2017-04-17 – 2017-04-19 (×6): 600 mg via ORAL
  Filled 2017-04-17 (×6): qty 1

## 2017-04-17 MED ORDER — LIDOCAINE HCL (PF) 1 % IJ SOLN
30.0000 mL | INTRAMUSCULAR | Status: DC | PRN
  Filled 2017-04-17: qty 30

## 2017-04-17 MED ORDER — DIPHENHYDRAMINE HCL 50 MG/ML IJ SOLN: 12.5000 mg | INTRAMUSCULAR | Status: DC | PRN

## 2017-04-17 MED ORDER — OXYTOCIN 40 UNITS IN LACTATED RINGERS INFUSION - SIMPLE MED: 2.5000 [IU]/h | INTRAVENOUS | Status: DC

## 2017-04-17 MED ORDER — FENTANYL 2.5 MCG/ML BUPIVACAINE 1/10 % EPIDURAL INFUSION (WH - ANES)
14.0000 mL/h | INTRAMUSCULAR | Status: DC | PRN
  Administered 2017-04-17: 14 mL/h via EPIDURAL
  Filled 2017-04-17: qty 100

## 2017-04-17 MED ORDER — TETANUS-DIPHTH-ACELL PERTUSSIS 5-2.5-18.5 LF-MCG/0.5 IM SUSP: 0.5000 mL | Freq: Once | INTRAMUSCULAR | Status: DC

## 2017-04-17 MED ORDER — LACTATED RINGERS IV SOLN: 500.0000 mL | INTRAVENOUS | Status: DC | PRN

## 2017-04-17 MED ORDER — ONDANSETRON HCL 4 MG/2ML IJ SOLN: 4.0000 mg | INTRAMUSCULAR | Status: DC | PRN

## 2017-04-17 MED ORDER — LACTATED RINGERS IV SOLN
INTRAVENOUS | Status: DC
  Administered 2017-04-17: 09:00:00 via INTRAVENOUS

## 2017-04-17 MED ORDER — ZOLPIDEM TARTRATE 5 MG PO TABS: 5.0000 mg | ORAL_TABLET | Freq: Every evening | ORAL | Status: DC | PRN

## 2017-04-17 MED ORDER — SOD CITRATE-CITRIC ACID 500-334 MG/5ML PO SOLN: 30.0000 mL | ORAL | Status: DC | PRN

## 2017-04-17 MED ORDER — LIDOCAINE HCL (PF) 1 % IJ SOLN
INTRAMUSCULAR | Status: DC | PRN
  Administered 2017-04-17: 4 mL
  Administered 2017-04-17: 6 mL

## 2017-04-17 MED ORDER — PHENYLEPHRINE 40 MCG/ML (10ML) SYRINGE FOR IV PUSH (FOR BLOOD PRESSURE SUPPORT)
80.0000 ug | PREFILLED_SYRINGE | INTRAVENOUS | Status: DC | PRN
  Filled 2017-04-17: qty 10
  Filled 2017-04-17: qty 5

## 2017-04-17 MED ORDER — EPHEDRINE 5 MG/ML INJ
10.0000 mg | INTRAVENOUS | Status: DC | PRN
  Filled 2017-04-17: qty 2

## 2017-04-17 MED ORDER — OXYCODONE-ACETAMINOPHEN 5-325 MG PO TABS: 2.0000 | ORAL_TABLET | ORAL | Status: DC | PRN

## 2017-04-17 MED ORDER — COCONUT OIL OIL: 1.0000 "application " | TOPICAL_OIL | Status: DC | PRN

## 2017-04-17 MED ORDER — ACETAMINOPHEN 325 MG PO TABS
650.0000 mg | ORAL_TABLET | ORAL | Status: DC | PRN
  Administered 2017-04-17: 650 mg via ORAL
  Filled 2017-04-17: qty 2

## 2017-04-17 MED ORDER — WITCH HAZEL-GLYCERIN EX PADS: 1.0000 "application " | MEDICATED_PAD | CUTANEOUS | Status: DC | PRN

## 2017-04-17 MED ORDER — BUTORPHANOL TARTRATE 1 MG/ML IJ SOLN
1.0000 mg | INTRAMUSCULAR | Status: DC | PRN
  Administered 2017-04-17: 1 mg via INTRAVENOUS
  Filled 2017-04-17: qty 1

## 2017-04-17 MED ORDER — TERBUTALINE SULFATE 1 MG/ML IJ SOLN
0.2500 mg | Freq: Once | INTRAMUSCULAR | Status: DC | PRN
  Filled 2017-04-17: qty 1

## 2017-04-17 MED ORDER — SIMETHICONE 80 MG PO CHEW: 80.0000 mg | CHEWABLE_TABLET | ORAL | Status: DC | PRN

## 2017-04-17 MED ORDER — DIPHENHYDRAMINE HCL 25 MG PO CAPS: 25.0000 mg | ORAL_CAPSULE | Freq: Four times a day (QID) | ORAL | Status: DC | PRN

## 2017-04-17 MED ORDER — ONDANSETRON HCL 4 MG/2ML IJ SOLN: 4.0000 mg | Freq: Four times a day (QID) | INTRAMUSCULAR | Status: DC | PRN

## 2017-04-17 MED ORDER — BENZOCAINE-MENTHOL 20-0.5 % EX AERO: 1.0000 "application " | INHALATION_SPRAY | CUTANEOUS | Status: DC | PRN

## 2017-04-17 MED ORDER — SENNOSIDES-DOCUSATE SODIUM 8.6-50 MG PO TABS
2.0000 | ORAL_TABLET | ORAL | Status: DC
  Administered 2017-04-17: 2 via ORAL
  Filled 2017-04-17 (×3): qty 2

## 2017-04-17 MED ORDER — OXYTOCIN BOLUS FROM INFUSION
500.0000 mL | Freq: Once | INTRAVENOUS | Status: AC
  Administered 2017-04-17: 500 mL via INTRAVENOUS

## 2017-04-17 MED ORDER — OXYCODONE HCL 5 MG PO TABS: 10.0000 mg | ORAL_TABLET | ORAL | Status: DC | PRN

## 2017-04-17 MED ORDER — OXYCODONE HCL 5 MG PO TABS
5.0000 mg | ORAL_TABLET | ORAL | Status: DC | PRN
  Administered 2017-04-18 – 2017-04-19 (×3): 5 mg via ORAL
  Filled 2017-04-17 (×3): qty 1

## 2017-04-17 MED ORDER — DIBUCAINE 1 % RE OINT: 1.0000 "application " | TOPICAL_OINTMENT | RECTAL | Status: DC | PRN

## 2017-04-17 MED ORDER — EPHEDRINE 5 MG/ML INJ
10.0000 mg | INTRAVENOUS | Status: DC | PRN
Start: 2017-04-17 — End: 2017-04-17
  Filled 2017-04-17: qty 2

## 2017-04-17 MED ORDER — ONDANSETRON HCL 4 MG PO TABS: 4.0000 mg | ORAL_TABLET | ORAL | Status: DC | PRN

## 2017-04-17 MED ORDER — ACETAMINOPHEN 325 MG PO TABS
650.0000 mg | ORAL_TABLET | ORAL | Status: DC | PRN
  Administered 2017-04-19: 650 mg via ORAL
  Filled 2017-04-17: qty 2

## 2017-04-17 MED ORDER — PHENYLEPHRINE 40 MCG/ML (10ML) SYRINGE FOR IV PUSH (FOR BLOOD PRESSURE SUPPORT)
80.0000 ug | PREFILLED_SYRINGE | INTRAVENOUS | Status: DC | PRN
  Filled 2017-04-17: qty 5

## 2017-04-17 MED ORDER — OXYTOCIN 40 UNITS IN LACTATED RINGERS INFUSION - SIMPLE MED
1.0000 m[IU]/min | INTRAVENOUS | Status: DC
  Administered 2017-04-17: 2 m[IU]/min via INTRAVENOUS
  Filled 2017-04-17: qty 1000

## 2017-04-17 MED ORDER — LACTATED RINGERS IV SOLN
500.0000 mL | Freq: Once | INTRAVENOUS | Status: AC
  Administered 2017-04-17: 500 mL via INTRAVENOUS

## 2017-04-17 MED ORDER — OXYCODONE-ACETAMINOPHEN 5-325 MG PO TABS: 1.0000 | ORAL_TABLET | ORAL | Status: DC | PRN

## 2017-04-17 NOTE — H&P (Signed)
Amber Duran is a 32 y.o. female [id number] at 5041 0/7wks presenting for post dates iol. Pt is dated per a 13 week US. Her prenatal care was complicated by placenta previa which resolved. She has a hx of genital herpes with no outbreaks in pregnancy - prophylactic treatment initiated at 36 weeks and no active lesions today. Pt also has a history of recurrent pregnancy loss and was treated with makena injections weekly from 16 -[redacted]weeks gestation. She is GBS negative and Cervix was 3cm dil at last office visit   OB History    Gravida  7   Para  1   Term  1   Preterm      AB  5   Living  1     SAB  3   TAB  2   Ectopic      Multiple      Live Births  1          Past Medical History:  Diagnosis Date  . Abnormal Pap smear   . Abscess    underarms  . Genital herpes in women    8 months   . GERD (gastroesophageal reflux disease)    diet controlled - no meds  . Hx of varicella   . Hypertension     no meds  . Pregnancy induced hypertension   . Reflux    diet controlled - no meds  . Trichomonas   . Vaginal Pap smear, abnormal    Past Surgical History:  Procedure Laterality Date  . DILATION AND CURETTAGE OF UTERUS    . DILATION AND EVACUATION  09/21/2010   Procedure: DILATATION AND EVACUATION (D&E);  Surgeon: Kathreen CosierBernard A Marshall, MD;  Location: [facility];  Service: Gynecology;  Laterality: N/A;  . EYE SURGERY     Family History: family history includes Crohn's disease in her maternal aunt and mother; Depression in her maternal grandmother; Diabetes in her maternal aunt and maternal uncle; Hypertension in her father, maternal grandfather, maternal grandmother, mother, paternal grandfather, and paternal grandmother; Miscarriages / [place] in her maternal aunt and mother. Social History:  reports that she quit smoking about 18 years ago. Her smoking use included cigarettes. She has a 0.25 pack-year smoking history. She has never used smokeless tobacco. She reports that she  does not drink alcohol or use drugs.     Maternal Diabetes: No Genetic Screening: Abnormal:  Results: Other:sma carrier, partner negative Maternal Ultrasounds/Referrals: Normal Fetal Ultrasounds or other Referrals:  None Maternal Substance Abuse:  No Significant Maternal Medications:  None Significant Maternal Lab Results:  None Other Comments:  None  Review of Systems  Constitutional: Positive for malaise/fatigue. Negative for chills, fever and weight loss.  Eyes: Negative for blurred vision.  Respiratory: Negative for shortness of breath.   Cardiovascular: Negative for chest pain.  Gastrointestinal: Positive for abdominal pain. Negative for heartburn, nausea and vomiting.  Genitourinary: Negative for dysuria.  Musculoskeletal: Positive for back pain. Negative for myalgias.  Skin: Negative for itching and rash.  Neurological: Negative for dizziness and headaches.  Endo/Heme/Allergies: Does not bruise/bleed easily.  Psychiatric/Behavioral: Negative for depression, hallucinations, substance abuse and suicidal ideas. The patient is not nervous/anxious.    Maternal Medical History:  Reason for admission: Nausea. Post dates pregnancy  Contractions: Frequency: rare.   Perceived severity is mild.    Fetal activity: Perceived fetal activity is normal.    Prenatal complications: Placental abnormality (previa - resolved).   Prenatal Complications - Diabetes: none.  Last menstrual period 07/16/2016, unknown if currently breastfeeding. Maternal Exam:  Uterine Assessment: Contraction strength is mild.  Contraction frequency is rare.   Abdomen: Patient reports no abdominal tenderness. Estimated fetal weight is AGA.   Fetal presentation: vertex  Introitus: Normal vulva. Vulva is negative for condylomata and lesion.  Normal vagina.  Vagina is negative for condylomata.  Pelvis: adequate for delivery.   Cervix: Cervix evaluated by digital exam.     Physical Exam   Constitutional: She is oriented to person, place, and time. She appears well-developed and well-nourished.  HENT:  Head: Normocephalic.  Neck: Normal range of motion.  Cardiovascular: Normal rate.  Respiratory: Effort normal.  GI: Soft.  Genitourinary: Vagina normal and uterus normal. Vulva exhibits no lesion.  Musculoskeletal: Normal range of motion. She exhibits edema.  Neurological: She is alert and oriented to person, place, and time.  Skin: Skin is warm.  Psychiatric: She has a normal mood and affect. Her behavior is normal. Judgment and thought content normal.    Prenatal labs: ABO, Rh: B/Positive/-- (10/17 0000) Antibody: Negative (10/17 0000) Rubella: Immune (10/17 0000) RPR: Nonreactive (10/17 0000)  HBsAg: Negative (10/17 0000)  HIV: Non-reactive (10/17 0000)  GBS: Negative (03/08 0000)   Assessment/Plan: [id number] at [redacted] wks gestation for induction of labor Start pitocin per protocol AROM to augment Pain control prn Anticipate svd with no comps CAT 1 tracing noted   Janean Sark Elizette Shek 04/17/2017, 8:37 AM

## 2017-04-17 NOTE — Progress Notes (Signed)
Jeronimo GreavesDanaesia S Gladwell is a 32 y.o. 308 711 1200G7P1051 at 5028w0d - she reports increasing discomfort with contractions ; stadol helped but still rates at 7/10 pain; considering epidural. +FMs  Subjective:   Objective: BP 129/87   Pulse 77   Temp 98.4 F (36.9 C) (Oral)   Resp 20   Ht 5\' 2"  (1.575 m)   Wt 186 lb 6.4 oz (84.6 kg)   LMP 07/16/2016   BMI 34.09 kg/m  No intake/output data recorded. No intake/output data recorded.  FHT:  FHR: 145 bpm, variability: minimal ,  accelerations:  Present,  decelerations:  Absent; Accels with SVE UC:   regular, every 3 minutes SVE:   Dilation: 4 Effacement (%): 80 Station: -2 Exam by:: Dr Mindi SlickerBanga  Labs: Lab Results  Component Value Date   WBC 11.0 (H) 04/17/2017   HGB 13.7 04/17/2017   HCT 40.0 04/17/2017   MCV 85.3 04/17/2017   PLT 173 04/17/2017    Assessment / Plan: IOL progressing well on 10mus of pitocin. AROM performed with moderate return of clear blood tinged fluid.  Labor: Progressing normally Preeclampsia:  n/a Fetal Wellbeing:  Category II; s/p stadol. Accels noted with sve Pain Control:  stadol: requesting epidural I/D:  n/a Anticipated MOD:  NSVD  Janean SarkCecilia W Amaya Blakeman 04/17/2017, 1:34 PM

## 2017-04-17 NOTE — Anesthesia Preprocedure Evaluation (Signed)
Anesthesia Evaluation  Patient identified by MRN, date of birth, ID band Patient awake    Reviewed: Allergy & Precautions, H&P , Patient's Chart, lab work & pertinent test results  Airway Mallampati: II  TM Distance: >3 FB Neck ROM: full    Dental  (+) Teeth Intact   Pulmonary former smoker,    breath sounds clear to auscultation       Cardiovascular hypertension,  Rhythm:regular Rate:Normal     Neuro/Psych    GI/Hepatic   Endo/Other    Renal/GU      Musculoskeletal   Abdominal   Peds  Hematology   Anesthesia Other Findings       Reproductive/Obstetrics (+) Pregnancy                             Anesthesia Physical  Anesthesia Plan  ASA: II  Anesthesia Plan: Epidural   Post-op Pain Management:    Induction:   PONV Risk Score and Plan:   Airway Management Planned:   Additional Equipment:   Intra-op Plan:   Post-operative Plan:   Informed Consent: I have reviewed the patients History and Physical, chart, labs and discussed the procedure including the risks, benefits and alternatives for the proposed anesthesia with the patient or authorized representative who has indicated his/her understanding and acceptance.   Dental Advisory Given  Plan Discussed with:   Anesthesia Plan Comments: (Labs checked- platelets confirmed with RN in room. Fetal heart tracing, per RN, reported to be stable enough for sitting procedure. Discussed epidural, and patient consents to the procedure:  included risk of possible headache,backache, failed block, allergic reaction, and nerve injury. This patient was asked if she had any questions or concerns before the procedure started.)        Anesthesia Quick Evaluation  

## 2017-04-17 NOTE — Anesthesia Procedure Notes (Signed)
Epidural Patient location during procedure: OB  Staffing Anesthesiologist: Jada Kuhnert, MD  Preanesthetic Checklist Completed: patient identified, pre-op evaluation, timeout performed, IV checked, risks and benefits discussed and monitors and equipment checked  Epidural Patient position: sitting Prep: DuraPrep Patient monitoring: blood pressure and continuous pulse ox Approach: right paramedian Location: L3-L4 Injection technique: LOR air  Needle:  Needle type: Tuohy  Needle gauge: 17 G Needle insertion depth: 7 cm Catheter type: closed end flexible Catheter size: 19 Gauge Catheter at skin depth: 14 cm Test dose: negative  Assessment Sensory level: T8  Additional Notes Dosing of Epidural:  1st dose, through catheter .............................................  Xylocaine 40 mg  2nd dose, through catheter, after waiting 3 minutes.........Xylocaine 60 mg    As each dose occurred, patient was free of IV sx; and patient exhibited no evidence of SA injection.  Patient is more comfortable after epidural dosed. Please see RN's note for documentation of vital signs,and FHR which are stable.  Patient reminded not to try to ambulate with numb legs, and that an RN must be present when she attempts to get up.            

## 2017-04-17 NOTE — Anesthesia Pain Management Evaluation Note (Signed)
2 CRNA Pain Management Visit Note  Patient: Amber Duran, 32 y.o., female  "Hello I am a member of the anesthesia team at [facility]. We have an anesthesia team available at all times to provide care throughout the hospital, including epidural management and anesthesia for C-section. I don't know your plan for the delivery whether it a natural birth, water birth, IV sedation, nitrous supplementation, doula or epidural, but we want to meet your pain goals."   1.Was your pain managed to your expectations on prior hospitalizations?   Yes   2.What is your expectation for pain management during this hospitalization?     Labor support without medications  3.How can we help you reach that goal? **support*  Record the patient's initial score and the patient's pain goal.   Pain: 1  Pain Goal: 9 [facility] wants you to be able to say your pain was always managed very well.  Trellis PaganiniBREWER,Laren Orama Duran 04/17/2017

## 2017-04-18 LAB — CBC
HEMATOCRIT: 31.7 % — AB (ref 36.0–46.0)
Hemoglobin: 10.6 g/dL — ABNORMAL LOW (ref 12.0–15.0)
MCH: 28.8 pg (ref 26.0–34.0)
MCHC: 33.4 g/dL (ref 30.0–36.0)
MCV: 86.1 fL (ref 78.0–100.0)
PLATELETS: 143 10*3/uL — AB (ref 150–400)
RBC: 3.68 MIL/uL — ABNORMAL LOW (ref 3.87–5.11)
RDW: 15.2 % (ref 11.5–15.5)
WBC: 11.4 10*3/uL — AB (ref 4.0–10.5)

## 2017-04-18 LAB — RPR: RPR Ser Ql: NONREACTIVE

## 2017-04-18 NOTE — Anesthesia Postprocedure Evaluation (Signed)
Anesthesia Post Note  Patient: Amber Duran  Procedure(s) Performed: AN AD HOC LABOR EPIDURAL     Patient location during evaluation: Mother Baby Anesthesia Type: Epidural Level of consciousness: awake and alert and oriented Pain management: satisfactory to patient Vital Signs Assessment: post-procedure vital signs reviewed and stable Respiratory status: respiratory function stable Cardiovascular status: stable Postop Assessment: no headache, no backache, epidural receding, patient able to bend at knees, no signs of nausea or vomiting and adequate PO intake Anesthetic complications: no    Last Vitals:  Vitals:   04/17/17 2309 04/18/17 0555  BP: 126/71 (!) 109/59  Pulse: 97 76  Resp: 18 16  Temp: 36.7 C 36.8 C  SpO2:      Last Pain:  Vitals:   04/18/17 0815  TempSrc:   PainSc: 3    Pain Goal: Patients Stated Pain Goal: 3 (04/18/17 0815)               Karleen DolphinFUSSELL,Sanam Marmo

## 2017-04-18 NOTE — Progress Notes (Signed)
Post Partum Day 1 Subjective: no complaints, up ad lib, voiding, tolerating PO and nl lochia, pain controlled  Objective: Blood pressure (!) 109/59, pulse 76, temperature 98.3 F (36.8 C), temperature source Oral, resp. rate 16, height 5\' 2"  (1.575 m), weight 84.6 kg (186 lb 6.4 oz), last menstrual period 07/16/2016, SpO2 100 %, unknown if currently breastfeeding.  Physical Exam:  General: alert and no distress Lochia: appropriate Uterine Fundus: firm   Recent Labs    04/17/17 0844 04/18/17 0607  HGB 13.7 10.6*  HCT 40.0 31.7*    Assessment/Plan: Plan for discharge tomorrow, Breastfeeding and Lactation consult.  Routine PP care.     LOS: 1 day   Amber Duran 04/18/2017, 7:23 AM

## 2017-04-18 NOTE — Lactation Note (Signed)
This note was copied from a Amber's chart. Lactation Consultation Note Amber is 13 hrs. Old. Mom states Amber is BF well. Mom has 674 yr old that she BF for 11 1/2 months w/o difficulty.  Reviewed newborn feeding habits, STS, I&O, cluster feeding, and breast massage and transfer. Mom encouraged to feed Amber 8-12 times/24 hours and with feeding cues.  Mom has long pendulous breast w/everted nipples. Mom states she has colostrum. Encouraged to call for assistance or questions.  [facility] brochure given w/resources, support groups and LC services.  Patient Name: Amber Girl Altamese CabalDanaesia Duran YNWGN'FToday's Date: 04/18/2017 Reason for consult: Initial assessment   Maternal Data Has patient been taught Hand Expression?: Yes Does the patient have breastfeeding experience prior to this delivery?: Yes  Feeding Feeding Type: Breast Fed  LATCH Score Latch: Grasps breast easily, tongue down, lips flanged, rhythmical sucking.  Audible Swallowing: A few with stimulation  Type of Nipple: Everted at rest and after stimulation  Comfort (Breast/Nipple): Soft / non-tender  Hold (Positioning): Assistance needed to correctly position infant at breast and maintain latch.  LATCH Score: 8  Interventions Interventions: Breast feeding basics reviewed;Support pillows;Assisted with latch;Breast massage;Hand express;Breast compression  Lactation Tools Discussed/Used     Consult Status Consult Status: Follow-up Date: 04/19/17 Follow-up type: In-patient    Charyl DancerCARVER, Amber Luera G 04/18/2017, 5:46 AM

## 2017-04-19 MED ORDER — OXYCODONE HCL 5 MG PO TABS: 5.0000 mg | ORAL_TABLET | ORAL | 0 refills | Status: DC | PRN

## 2017-04-19 MED ORDER — IBUPROFEN 600 MG PO TABS: 600.0000 mg | ORAL_TABLET | Freq: Four times a day (QID) | ORAL | 0 refills | Status: DC

## 2017-04-19 NOTE — Progress Notes (Signed)
Post Partum Day 2 Subjective: up ad lib and tolerating PO   Having some cramping and back pain requiring more than motrin Objective: Blood pressure 102/63, pulse 83, temperature 98.4 F (36.9 C), temperature source Oral, resp. rate 16, height 5\' 2"  (1.575 m), weight 186 lb 6.4 oz (84.6 kg), last menstrual period 07/16/2016, SpO2 100 %, unknown if currently breastfeeding.  Physical Exam:  General: alert and cooperative Lochia: appropriate Uterine Fundus: firm   Recent Labs    04/17/17 0844 04/18/17 0607  HGB 13.7 10.6*  HCT 40.0 31.7*    Assessment/Plan: Discharge home   LOS: 2 days   Oliver PilaKathy W Zebastian Carico 04/19/2017, 9:34 AM

## 2017-04-19 NOTE — Lactation Note (Signed)
This note was copied from a Amber's chart. Lactation Consultation Note  Patient Name: Amber Duran HYQMV'HToday's Date: 04/19/2017 Reason for consult: Follow-up assessment Mom states feedings are going well but Amber sucks herself onto breast which is painful.  Mom using cradle hold.  Assisted with positioning Amber in football hold.  Mom shown how to wait for wide open mouth and bring Amber to breast.  Amber latched easily and well.  Mom comfortable.  Discharge teaching done including engorgement treatment.  Lactation outpatient services and support information reviewed and encouraged prn.  Maternal Data    Feeding Feeding Type: Breast Fed  LATCH Score Latch: Grasps breast easily, tongue down, lips flanged, rhythmical sucking.  Audible Swallowing: Spontaneous and intermittent  Type of Nipple: Everted at rest and after stimulation  Comfort (Breast/Nipple): Soft / non-tender  Hold (Positioning): Assistance needed to correctly position infant at breast and maintain latch.  LATCH Score: 9  Interventions Interventions: Breast feeding basics reviewed;Assisted with latch  Lactation Tools Discussed/Used     Consult Status Consult Status: Complete Follow-up type: Call as needed    Huston FoleyMOULDEN, Suzana Sohail S 04/19/2017, 8:25 AM

## 2017-04-19 NOTE — Discharge Summary (Signed)
OB Discharge Summary     Patient Name: Amber Duran DOB: 11-22-1985 MRN: [id number]  Date of admission: 04/17/2017 Delivering MD: Pryor OchoaBANGA, CECILIA [facility]   Date of discharge: 04/19/2017  Admitting diagnosis: INDUCTION Intrauterine pregnancy: 6912w0d     Secondary diagnosis:  Active Problems:   Post-dates pregnancy   SVD (spontaneous vaginal delivery)   Postpartum care following vaginal delivery  Additional problems: none      Discharge diagnosis: Term Pregnancy Delivered                                                                                                Post partum procedures:none  Augmentation: AROM and Pitocin  Complications: None  Hospital course:  Induction of Labor With Vaginal Delivery   32 y.o. yo [id number] at 3612w0d was admitted to the hospital 04/17/2017 for induction of labor.  Indication for induction: Postdates.  Patient had an uncomplicated labor course as follows: Membrane Rupture Time/Date: 1:26 PM ,04/17/2017   Intrapartum Procedures: Episiotomy: None [1]                                         Lacerations:  None [1]  Patient had delivery of a Viable infant.  Information for the patient's newborn:  Amber Duran, Baby Girl Amber Duran [[id number]][[id number]]  Delivery Method: Vaginal, Spontaneous(Filed from Delivery Summary)   04/17/2017  Details of delivery can be found in separate delivery note.  Patient had a routine postpartum course. Patient is discharged home 04/19/17.  Physical exam  Vitals:   04/18/17 1747 04/18/17 1904 04/19/17 0145 04/19/17 0604  BP: 128/74 114/63 124/73 102/63  Pulse: 88 83    Resp: 18 18  16   Temp: 98.5 F (36.9 C) 98.7 F (37.1 C)  98.4 F (36.9 C)  TempSrc: Axillary Oral  Oral  SpO2:      Weight:      Height:       General: alert and cooperative Lochia: appropriate Uterine Fundus: firm  Labs: Lab Results  Component Value Date   WBC 11.4 (H) 04/18/2017   HGB 10.6 (L) 04/18/2017   HCT 31.7 (L) 04/18/2017   MCV 86.1 04/18/2017   PLT 143 (L) 04/18/2017   CMP Latest Ref Rng & Units 04/05/2012  Glucose 70 - 99 mg/dL 130(Q101(H)  BUN 6 - 23 mg/dL 6  Creatinine 6.570.50 - 8.461.10 mg/dL 9.620.71  Sodium 952135 - 841145 mEq/L 138  Potassium 3.5 - 5.1 mEq/L 3.2(L)  Chloride 96 - 112 mEq/L 103  CO2 19 - 32 mEq/L 21  Calcium 8.4 - 10.5 mg/dL 9.5  Total Protein 6.0 - 8.3 g/dL 7.0  Total Bilirubin 0.3 - 1.2 mg/dL 0.4  Alkaline Phos 39 - 117 U/L 180(H)  AST 0 - 37 U/L 19  ALT 0 - 35 U/L 16    Discharge instruction: per After Visit Summary and "Baby and Me Booklet".  After visit meds:  Allergies as of 04/19/2017   No Known Allergies     Medication List  STOP taking these medications   calcium carbonate 500 MG chewable tablet Commonly known as:  TUMS - dosed in mg elemental calcium   valACYclovir 1000 MG tablet Commonly known as:  VALTREX     TAKE these medications   acetaminophen 500 MG tablet Commonly known as:  TYLENOL Take 1,000 mg by mouth every 6 (six) hours as needed for mild pain or headache.   ibuprofen 600 MG tablet Commonly known as:  ADVIL,MOTRIN Take 1 tablet (600 mg total) by mouth every 6 (six) hours.   oxyCODONE 5 MG immediate release tablet Commonly known as:  Oxy IR/ROXICODONE Take 1 tablet (5 mg total) by mouth every 4 (four) hours as needed (pain scale 4-7).   prenatal multivitamin Tabs tablet Take 1 tablet by mouth daily at 12 noon.       Diet: routine diet  Activity: Advance as tolerated. Pelvic rest for 6 weeks.   Outpatient follow up:6 weeks Follow up Appt:No future appointments. Follow up Visit:No follow-ups on file.  Postpartum contraception: Undecided  Newborn Data: Live born female  Birth Weight: 6 lb 11.9 oz (3059 g) APGAR: 8, 9  Newborn Delivery   Birth date/time:  04/17/2017 16:22:00 Delivery type:  Vaginal, Spontaneous     Baby Feeding: Breast Disposition:home with mother   04/19/2017 Amber Pila, MD

## 2018-12-17 ENCOUNTER — Ambulatory Visit (HOSPITAL_COMMUNITY)
Admission: EM | Admit: 2018-12-17 | Discharge: 2018-12-17 | Disposition: A | Discharge status: Home / Self Care | Payer: Medicaid Other | Attending: Family Medicine | Admitting: Family Medicine

## 2018-12-17 ENCOUNTER — Other Ambulatory Visit: Payer: Self-pay

## 2018-12-17 ENCOUNTER — Encounter (HOSPITAL_COMMUNITY): Payer: Self-pay

## 2018-12-17 DIAGNOSIS — L02412 Cutaneous abscess of left axilla: Secondary | ICD-10-CM

## 2018-12-17 MED ORDER — HYDROCODONE-ACETAMINOPHEN 7.5-325 MG PO TABS: 1.0000 | ORAL_TABLET | Freq: Four times a day (QID) | ORAL | 0 refills | Status: DC | PRN

## 2018-12-17 MED ORDER — SULFAMETHOXAZOLE-TRIMETHOPRIM 800-160 MG PO TABS: 1.0000 | ORAL_TABLET | Freq: Two times a day (BID) | ORAL | 0 refills | Status: AC

## 2018-12-17 MED ORDER — HYDROCODONE-ACETAMINOPHEN 5-325 MG PO TABS
ORAL_TABLET | ORAL | Status: AC
  Filled 2018-12-17: qty 1

## 2018-12-17 MED ORDER — HYDROCODONE-ACETAMINOPHEN 5-325 MG PO TABS
1.0000 | ORAL_TABLET | Freq: Once | ORAL | Status: AC
  Administered 2018-12-17: 1 via ORAL

## 2018-12-17 NOTE — ED Provider Notes (Signed)
[place]    CSN: [id number] Arrival date & time: 12/17/18  1824      History   Chief Complaint Chief Complaint  Patient presents with  . Abscess    HPI Amber Duran is a 33 y.o. female.   HPI  Patient has a history of hidradenitis suppurativa.  Multiple abscesses.  She is had multiple abscesses in her underarms.  The one in her left axilla has been present for a week.  It is quite large.  Very painful.  She has been using warm compresses.  Past Medical History:  Diagnosis Date  . Abnormal Pap smear   . Abscess    underarms  . Genital herpes in women    8 months   . GERD (gastroesophageal reflux disease)    diet controlled - no meds  . Hx of varicella   . Hypertension     no meds  . Pregnancy induced hypertension   . Reflux    diet controlled - no meds  . Trichomonas   . Vaginal Pap smear, abnormal     Patient Active Problem List   Diagnosis Date Noted  . Post-dates pregnancy 04/17/2017  . SVD (spontaneous vaginal delivery) 04/17/2017  . Postpartum care following vaginal delivery 04/17/2017  . 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 DILATION AND CURETTAGE OF UTERUS    . DILATION AND EVACUATION  09/21/2010   Procedure: DILATATION AND EVACUATION (D&E);  Surgeon: Frederico Hamman, MD;  Location: [facility];  Service: Gynecology;  Laterality: N/A;  . EYE SURGERY      OB History    Gravida  7   Para  2   Term  2   Preterm      AB  5   Living  2     SAB  3   TAB  2   Ectopic      Multiple  0   Live Births  2            Home Medications    Prior to Admission medications   Medication Sig Start Date End Date Taking? Authorizing Provider  acetaminophen (TYLENOL) 500 MG tablet Take 1,000 mg by mouth every 6 (six) hours as needed for mild pain or headache.    [provider]  HYDROcodone-acetaminophen (NORCO) 7.5-325 MG tablet Take 1 tablet by mouth every 6 (six)  hours as needed for moderate pain. 12/17/18   Raylene Everts, MD  ibuprofen (ADVIL,MOTRIN) 600 MG tablet Take 1 tablet (600 mg total) by mouth every 6 (six) hours. 04/19/17   Paula Compton, MD  Prenatal Vit-Fe Fumarate-FA (PRENATAL MULTIVITAMIN) TABS tablet Take 1 tablet by mouth daily at 12 noon.    [provider]  sulfamethoxazole-trimethoprim (BACTRIM DS) 800-160 MG tablet Take 1 tablet by mouth 2 (two) times daily for 7 days. 12/17/18 12/24/18  Raylene Everts, MD    Family History Family History  Problem Relation Age of Onset  . Miscarriages / [place] Mother   . Hypertension Mother   . Crohn's disease Mother   . Hypertension Father   . Miscarriages / Stillbirths Maternal Aunt   . Crohn's disease Maternal Aunt   . Diabetes Maternal Aunt   . Hypertension Maternal Grandmother   . Depression Maternal Grandmother   . Hypertension Maternal Grandfather   . Hypertension Paternal Grandmother   . Hypertension Paternal Grandfather   . Diabetes Maternal Uncle  Social History Social History   Tobacco Use  . Smoking status: Former Smoker    Packs/day: 0.25    Years: 1.00    Pack years: 0.25    Types: Cigarettes    Quit date: 09/15/1998    Years since quitting: 20.2  . Smokeless tobacco: Never Used  Substance Use Topics  . Alcohol use: No  . Drug use: No     Allergies   Patient has no known allergies.   Review of Systems Review of Systems  Constitutional: Negative for chills and fever.  HENT: Negative for ear pain and sore throat.   Eyes: Negative for pain and visual disturbance.  Respiratory: Negative for cough and shortness of breath.   Cardiovascular: Negative for chest pain and palpitations.  Gastrointestinal: Negative for abdominal pain and vomiting.  Genitourinary: Negative for dysuria and hematuria.  Musculoskeletal: Negative for arthralgias and back pain.  Skin: Positive for wound. Negative for color change and rash.  Neurological:  Negative for seizures and syncope.  All other systems reviewed and are negative.    Physical Exam Triage Vital Signs ED Triage Vitals  Enc Vitals Group     BP 12/17/18 1928 (!) 135/100     Pulse Rate 12/17/18 1928 90     Resp 12/17/18 1928 18     Temp 12/17/18 1928 99 F (37.2 C)     Temp Source 12/17/18 1928 Oral     SpO2 12/17/18 1928 100 %     Weight --      Height --      Head Circumference --      Peak Flow --      Pain Score 12/17/18 1929 8     Pain Loc --      Pain Edu? --      Excl. in GC? --    No data found.  Updated Vital Signs BP (!) 135/100 (BP Location: Left Arm)   Pulse 90   Temp 99 F (37.2 C) (Oral)   Resp 18   LMP 11/28/2018   SpO2 100%   Visual Acuity Right Eye Distance:   Left Eye Distance:   Bilateral Distance:    Right Eye Near:   Left Eye Near:    Bilateral Near:     Physical Exam Constitutional:      General: She is not in acute distress.    Appearance: She is well-developed.  HENT:     Head: Normocephalic and atraumatic.     Mouth/Throat:     Comments: Mask in place Eyes:     Conjunctiva/sclera: Conjunctivae normal.     Pupils: Pupils are equal, round, and reactive to light.  Neck:     Musculoskeletal: Normal range of motion.  Cardiovascular:     Rate and Rhythm: Normal rate and regular rhythm.     Heart sounds: Normal heart sounds.  Pulmonary:     Effort: Pulmonary effort is normal. No respiratory distress.     Breath sounds: Normal breath sounds.  Abdominal:     General: There is no distension.     Palpations: Abdomen is soft.  Musculoskeletal: Normal range of motion.  Skin:    General: Skin is warm and dry.     Comments: Left axilla is scarred.  Irregular appearance.  Large swollen area centrally.  4 cm across.  Difficult to tell the fluctuance in the middle although clearly there is an abscess.  Neurological:     General: No focal deficit present.  Mental Status: She is alert.  Psychiatric:        Mood and  Affect: Mood normal.        Behavior: Behavior normal.      UC Treatments / Results  Labs (all labs ordered are listed, but only abnormal results are displayed) Labs Reviewed - No data to display  EKG   Radiology No results found.  Procedures Incision and Drainage  Date/Time: 12/17/2018 8:16 PM Performed by: Eustace MooreNelson, Forever Arechiga Sue, MD Authorized by: Eustace MooreNelson, Gershon Shorten Sue, MD   Consent:    Consent obtained:  Verbal   Consent given by:  Patient   Risks discussed:  Incomplete drainage   Alternatives discussed:  No treatment Location:    Type:  Abscess   Location:  Upper extremity Pre-procedure details:    Skin preparation:  Betadine Anesthesia (see MAR for exact dosages):    Anesthesia method:  Local infiltration   Local anesthetic:  Lidocaine 1% WITH epi Procedure type:    Complexity:  Complex Procedure details:    Needle aspiration: no     Incision types:  Stab incision   Incision depth:  Subcutaneous   Scalpel blade:  11   Wound management:  Probed and deloculated   Drainage:  Purulent   Drainage amount:  Copious   Wound treatment:  Wound left open   Packing materials:  None Post-procedure details:    Patient tolerance of procedure:  Tolerated well, no immediate complications   (including critical care time)  Medications Ordered in UC Medications  HYDROcodone-acetaminophen (NORCO/VICODIN) 5-325 MG per tablet 1 tablet (1 tablet Oral Given 12/17/18 2011)  HYDROcodone-acetaminophen (NORCO/VICODIN) 5-325 MG per tablet (has no administration in time range)    Initial Impression / Assessment and Plan / UC Course  I have reviewed the triage vital signs and the nursing notes.  Pertinent labs & imaging results that were available during my care of the patient were reviewed by me and considered in my medical decision making (see chart for details).      Final Clinical Impressions(s) / UC Diagnoses   Final diagnoses:  Abscess of left axilla     Discharge  Instructions     Warm compresses to area Septra antibiotic 2 x a day Take pain as needed Follow up if fails to resolve   ED Prescriptions    Medication Sig Dispense Auth. Provider   HYDROcodone-acetaminophen (NORCO) 7.5-325 MG tablet Take 1 tablet by mouth every 6 (six) hours as needed for moderate pain. 15 tablet Eustace MooreNelson, Artemisia Auvil Sue, MD   sulfamethoxazole-trimethoprim (BACTRIM DS) 800-160 MG tablet Take 1 tablet by mouth 2 (two) times daily for 7 days. 14 tablet Eustace MooreNelson, Cherye Gaertner Sue, MD     I have reviewed the PDMP during this encounter.   Eustace MooreNelson, Kyrielle Urbanski Sue, MD 12/17/18 2017

## 2018-12-17 NOTE — ED Triage Notes (Signed)
Pt presents with abscess under left arm X 3 days.

## 2018-12-17 NOTE — Discharge Instructions (Addendum)
Warm compresses to area Septra antibiotic 2 x a day Take pain as needed Follow up if fails to resolve

## 2019-01-15 ENCOUNTER — Other Ambulatory Visit: Payer: Self-pay

## 2019-01-15 DIAGNOSIS — Z20822 Contact with and (suspected) exposure to covid-19: Secondary | ICD-10-CM

## 2019-01-16 LAB — NOVEL CORONAVIRUS, NAA: SARS-CoV-2, NAA: NOT DETECTED

## 2019-02-08 ENCOUNTER — Ambulatory Visit (HOSPITAL_COMMUNITY)
Admission: EM | Admit: 2019-02-08 | Discharge: 2019-02-08 | Disposition: A | Discharge status: Home / Self Care | Payer: Medicaid Other | Attending: Urgent Care | Admitting: Urgent Care

## 2019-02-08 ENCOUNTER — Encounter (HOSPITAL_COMMUNITY): Payer: Self-pay

## 2019-02-08 DIAGNOSIS — L02411 Cutaneous abscess of right axilla: Secondary | ICD-10-CM

## 2019-02-08 DIAGNOSIS — M79621 Pain in right upper arm: Secondary | ICD-10-CM

## 2019-02-08 MED ORDER — CEPHALEXIN 500 MG PO CAPS: 500.0000 mg | ORAL_CAPSULE | Freq: Three times a day (TID) | ORAL | 0 refills | Status: DC

## 2019-02-08 MED ORDER — NAPROXEN 500 MG PO TABS: 500.0000 mg | ORAL_TABLET | Freq: Two times a day (BID) | ORAL | 0 refills | Status: DC

## 2019-02-08 MED ORDER — LIDOCAINE-EPINEPHRINE (PF) 2 %-1:200000 IJ SOLN
INTRAMUSCULAR | Status: AC
  Filled 2019-02-08: qty 20

## 2019-02-08 NOTE — ED Provider Notes (Signed)
[facility]   MRN: [id number] DOB: 22-Aug-1985  Subjective:   Amber Duran is a 34 y.o. female presenting for 3-day history of recurrent right axillary arm pain and swelling that is radiating to her right upper arm.  Patient has a history of recurrent abscesses, alternate between the left and the right armpit.  Symptoms can be associated with her cycle.  Has never gotten a consult with a dermatologist.  Has had multiple I&D's before mostly with resolution.    No Known Allergies  Past Medical History:  Diagnosis Date  . Abnormal Pap smear   . Abscess    underarms  . Genital herpes in women    8 months   . GERD (gastroesophageal reflux disease)    diet controlled - no meds  . Hx of varicella   . Hypertension     no meds  . Pregnancy induced hypertension   . Reflux    diet controlled - no meds  . Trichomonas   . Vaginal Pap smear, abnormal      Past Surgical History:  Procedure Laterality Date  . DILATION AND CURETTAGE OF UTERUS    . DILATION AND EVACUATION  09/21/2010   Procedure: DILATATION AND EVACUATION (D&E);  Surgeon: Kathreen Cosier, MD;  Location: [facility];  Service: Gynecology;  Laterality: N/A;  . EYE SURGERY      Family History  Problem Relation Age of Onset  . Miscarriages / [place] Mother   . Hypertension Mother   . Crohn's disease Mother   . Hypertension Father   . Miscarriages / Stillbirths Maternal Aunt   . Crohn's disease Maternal Aunt   . Diabetes Maternal Aunt   . Hypertension Maternal Grandmother   . Depression Maternal Grandmother   . Hypertension Maternal Grandfather   . Hypertension Paternal Grandmother   . Hypertension Paternal Grandfather   . Diabetes Maternal Uncle     Social History   Tobacco Use  . Smoking status: Former Smoker    Packs/day: 0.25    Years: 1.00    Pack years: 0.25    Types: Cigarettes    Quit date: 09/15/1998    Years since quitting: 20.4  . Smokeless tobacco: Never Used  Substance Use Topics    . Alcohol use: No  . Drug use: No    ROS   Objective:   Vitals: BP 121/87 (BP Location: Right Arm)   Pulse 90   Temp 98.6 F (37 C) (Oral)   Resp 16   LMP 02/05/2019   SpO2 100%   Physical Exam Constitutional:      General: She is not in acute distress.    Appearance: Normal appearance. She is well-developed. She is not ill-appearing, toxic-appearing or diaphoretic.  HENT:     Head: Normocephalic and atraumatic.     Nose: Nose normal.     Mouth/Throat:     Mouth: Mucous membranes are moist.     Pharynx: Oropharynx is clear.  Eyes:     General: No scleral icterus.    Extraocular Movements: Extraocular movements intact.     Pupils: Pupils are equal, round, and reactive to light.  Cardiovascular:     Rate and Rhythm: Normal rate.  Pulmonary:     Effort: Pulmonary effort is normal.  Skin:    General: Skin is warm and dry.       Neurological:     General: No focal deficit present.     Mental Status: She is alert and oriented to  person, place, and time.  Psychiatric:        Mood and Affect: Mood normal.        Behavior: Behavior normal.     PROCEDURE NOTE: I&D of Abscess Verbal consent obtained. Local anesthesia with 2cc of 2% lidocaine with epinephrine. Site cleansed with chlorhexidine. Incision of 1cm was made using a 11 blade, discharge of <1/2cc pus and serosanguinous fluid. Wound cavity was explored with curved hemostats and has a wound depth of ~1cm distally toward her arm. Cleansed and dressed.   Assessment and Plan :   1. Abscess of axilla, right   2. Pain in right axilla   3. Pain in right upper arm     Incision and drainage did not yield much drainage.  Suspect possible hidradenitis versus recurrent axillary abscess.  Will start patient on Keflex 3 times daily, use naproxen for pain and inflammation.  [facility], also provided her with information to other dermatologist that she requested. Counseled patient  on potential for adverse effects with medications prescribed/recommended today, ER and return-to-clinic precautions discussed, patient verbalized understanding.    Jaynee Eagles, PA-C 02/08/19 1303

## 2019-02-08 NOTE — ED Triage Notes (Signed)
Pt presents to UC with and abscess in her right axilla x 3 days.

## 2019-02-08 NOTE — Discharge Instructions (Addendum)
Please make sure you change your dressing twice daily.  Once the wound closes I would wait another day or two before you stop using dressing changes.  I [facility]  They should give you a call to set up an appointment within the next 2 weeks.  You are also welcome to contact some of the dermatology practices that I provided to you in your visit summary.

## 2019-04-04 ENCOUNTER — Other Ambulatory Visit: Payer: Self-pay | Admitting: Surgery

## 2019-05-21 ENCOUNTER — Ambulatory Visit (HOSPITAL_COMMUNITY)
Admission: EM | Admit: 2019-05-21 | Discharge: 2019-05-21 | Disposition: A | Discharge status: Home / Self Care | Payer: Medicaid Other | Attending: Emergency Medicine | Admitting: Emergency Medicine

## 2019-05-21 ENCOUNTER — Ambulatory Visit (INDEPENDENT_AMBULATORY_CARE_PROVIDER_SITE_OTHER): Payer: Medicaid Other

## 2019-05-21 ENCOUNTER — Encounter (HOSPITAL_COMMUNITY): Payer: Self-pay

## 2019-05-21 ENCOUNTER — Other Ambulatory Visit: Payer: Self-pay

## 2019-05-21 DIAGNOSIS — M25512 Pain in left shoulder: Secondary | ICD-10-CM | POA: Diagnosis not present

## 2019-05-21 MED ORDER — IBUPROFEN 800 MG PO TABS: 800.0000 mg | ORAL_TABLET | Freq: Three times a day (TID) | ORAL | 0 refills | Status: DC

## 2019-05-21 NOTE — ED Triage Notes (Signed)
Patient reports she was laying on her stomach this morning and reached out to adjust her arm went limp.   Patient reports LMP 3/16 but states there was a termination on 5/6.

## 2019-05-21 NOTE — ED Provider Notes (Signed)
[facility]    CSN: [id number] Arrival date & time: 05/21/19  6948      History   Chief Complaint Chief Complaint  Patient presents with  . Shoulder Injury    HPI Amber Duran is a 34 y.o. female.   Who presented to the urgent care with a complaint of left shoulder pain starting this a.m.  She states 6 months ago she was on a scooter and fell on her left shoulder. Stated that pain resolved.  She was trying to reach for something this AM and felt like her shoulder drop.  She localizes the pain to the left shoulder.  She describes the pain as constant and achy, rated at 7 on a scale 1-10.  She has tried OTC medications without relief.  Her symptoms are made worse with ROM.  She denies similar symptoms in the past.  Denies chills, fever, nausea, vomiting, diarrhea, recent injury or trauma.  The history is provided by the patient. No language interpreter was used.  Shoulder Injury    Past Medical History:  Diagnosis Date  . Abnormal Pap smear   . Abscess    underarms  . Genital herpes in women    8 months   . GERD (gastroesophageal reflux disease)    diet controlled - no meds  . Hx of varicella   . Hypertension     no meds  . Pregnancy induced hypertension   . Reflux    diet controlled - no meds  . Trichomonas   . Vaginal Pap smear, abnormal     Patient Active Problem List   Diagnosis Date Noted  . Post-dates pregnancy 04/17/2017  . SVD (spontaneous vaginal delivery) 04/17/2017  . Postpartum care following vaginal delivery 04/17/2017  . 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 DILATION AND CURETTAGE OF UTERUS    . DILATION AND EVACUATION  09/21/2010   Procedure: DILATATION AND EVACUATION (D&E);  Surgeon: Kathreen Cosier, MD;  Location: [facility];  Service: Gynecology;  Laterality: N/A;  . EYE SURGERY      OB History    Gravida  7   Para  2   Term  2   Preterm      AB  5   Living  2     SAB  3   TAB  2   Ectopic      Multiple  0   Live Births  2            Home Medications    Prior to Admission medications   Medication Sig Start Date End Date Taking? Authorizing Provider  cephALEXin (KEFLEX) 500 MG capsule Take 1 capsule (500 mg total) by mouth 3 (three) times daily. 02/08/19   Wallis Bamberg, PA-C  naproxen (NAPROSYN) 500 MG tablet Take 1 tablet (500 mg total) by mouth 2 (two) times daily. 02/08/19   Wallis Bamberg, PA-C    Family History Family History  Problem Relation Age of Onset  . Miscarriages / [place] Mother   . Hypertension Mother   . Crohn's disease Mother   . Hypertension Father   . Miscarriages / Stillbirths Maternal Aunt   . Crohn's disease Maternal Aunt   . Diabetes Maternal Aunt   . Hypertension Maternal Grandmother   . Depression Maternal Grandmother   . Hypertension Maternal Grandfather   . Hypertension Paternal Grandmother   . Hypertension Paternal Grandfather   . Diabetes Maternal Uncle  Social History Social History   Tobacco Use  . Smoking status: Former Smoker    Packs/day: 0.25    Years: 1.00    Pack years: 0.25    Types: Cigarettes    Quit date: 09/15/1998    Years since quitting: 20.6  . Smokeless tobacco: Never Used  Substance Use Topics  . Alcohol use: No  . Drug use: No     Allergies   Patient has no known allergies.   Review of Systems Review of Systems  Constitutional: Negative.   Respiratory: Negative.   Cardiovascular: Negative.   Musculoskeletal: Positive for arthralgias.  All other systems reviewed and are negative.    Physical Exam Triage Vital Signs ED Triage Vitals  Enc Vitals Group     BP 05/21/19 0828 118/68     Pulse Rate 05/21/19 0828 87     Resp 05/21/19 0828 16     Temp 05/21/19 0828 98.3 F (36.8 C)     Temp Source 05/21/19 0828 Oral     SpO2 05/21/19 0828 98 %     Weight --      Height --      Head Circumference --      Peak Flow --      Pain Score 05/21/19 0827 7       Pain Loc --      Pain Edu? --      Excl. in [place]? --    No data found.  Updated Vital Signs BP 118/68 (BP Location: Right Arm)   Pulse 87   Temp 98.3 F (36.8 C) (Oral)   Resp 16   LMP 03/25/2019 (Exact Date)   SpO2 98%   Visual Acuity Right Eye Distance:   Left Eye Distance:   Bilateral Distance:    Right Eye Near:   Left Eye Near:    Bilateral Near:     Physical Exam Vitals and nursing note reviewed.  Constitutional:      General: She is not in acute distress.    Appearance: Normal appearance. She is normal weight. She is not ill-appearing, toxic-appearing or diaphoretic.  Cardiovascular:     Rate and Rhythm: Normal rate and regular rhythm.     Pulses: Normal pulses.     Heart sounds: Normal heart sounds. No murmur. No friction rub. No gallop.   Pulmonary:     Effort: Pulmonary effort is normal. No respiratory distress.     Breath sounds: Normal breath sounds. No stridor. No wheezing, rhonchi or rales.  Chest:     Chest wall: No tenderness.  Musculoskeletal:        General: Tenderness present.     Right shoulder: Normal.     Left shoulder: Tenderness present.     Comments: Left shoulder extremity obvious asymmetry or deformity when compared to the right shoulder.  There is no surface trauma, ecchymosis, warmth, swelling.  Tenderness to palpation.  Limited range of motion due to pain.  Neurovascular status is intact.  Neurological:     Mental Status: She is alert.      UC Treatments / Results  Labs (all labs ordered are listed, but only abnormal results are displayed) Labs Reviewed - No data to display  EKG   Radiology DG Shoulder Left  Result Date: 05/21/2019 CLINICAL DATA:  Pain with limited range of motion EXAM: LEFT SHOULDER - 2+ VIEW COMPARISON:  None. FINDINGS: Frontal, oblique, and Y scapular images obtained. No fracture or dislocation. Joint spaces appear normal. No erosive  change or intra-articular calcification. Left lung clear. IMPRESSION: No  fracture or dislocation.  No evident arthropathy. Electronically Signed   By: Bretta Bang III M.D.   On: 05/21/2019 09:19    Procedures Procedures (including critical care time)  Medications Ordered in UC Medications - No data to display  Initial Impression / Assessment and Plan / UC Course  I have reviewed the triage vital signs and the nursing notes.  Pertinent labs & imaging results that were available during my care of the patient were reviewed by me and considered in my medical decision making (see chart for details).    Patient is stable at discharge.  X-ray is negative for bony abnormality including fracture or dislocation.  I have reviewed the x-ray myself and the radiologist interpretation.  I am in agreement with the radiologist interpretation.  Ibuprofen was prescribed for pain.  She was advised to follow RICE instruction.  To follow with PCP or return for worsening symptoms.  Final Clinical Impressions(s) / UC Diagnoses   Final diagnoses:  Acute pain of left shoulder     Discharge Instructions     Ibuprofen was prescribed for pain Follow RICE instruction that is attached Follow-up with PCP Return or go to ED for worsening of symptoms    ED Prescriptions    None     PDMP not reviewed this encounter.   Durward Parcel, FNP 05/21/19 [phone]

## 2019-05-21 NOTE — Discharge Instructions (Addendum)
Ibuprofen was prescribed for pain Follow RICE instruction that is attached Follow-up with PCP Return or go to ED for worsening of symptoms

## 2020-01-11 ENCOUNTER — Ambulatory Visit (HOSPITAL_COMMUNITY)
Admission: EM | Admit: 2020-01-11 | Discharge: 2020-01-11 | Disposition: A | Discharge status: Home / Self Care | Payer: Medicaid Other

## 2020-01-11 ENCOUNTER — Other Ambulatory Visit: Payer: Self-pay

## 2020-04-22 DIAGNOSIS — L732 Hidradenitis suppurativa: Secondary | ICD-10-CM | POA: Insufficient documentation

## 2020-04-22 DIAGNOSIS — N96 Recurrent pregnancy loss: Secondary | ICD-10-CM | POA: Insufficient documentation

## 2020-11-30 DIAGNOSIS — I10 Essential (primary) hypertension: Secondary | ICD-10-CM | POA: Insufficient documentation

## 2020-12-28 DIAGNOSIS — F53 Postpartum depression: Secondary | ICD-10-CM | POA: Insufficient documentation

## 2021-07-12 ENCOUNTER — Encounter (HOSPITAL_COMMUNITY): Payer: Self-pay

## 2021-07-12 ENCOUNTER — Ambulatory Visit (HOSPITAL_COMMUNITY)
Admission: RE | Admit: 2021-07-12 | Discharge: 2021-07-12 | Disposition: A | Discharge status: Home / Self Care | Payer: Medicaid Other | Source: Ambulatory Visit

## 2021-07-12 VITALS — BP 135/87 | HR 79 | Temp 97.2°F | Resp 16 | Ht 62.0 in | Wt 180.0 lb

## 2021-07-12 DIAGNOSIS — H1033 Unspecified acute conjunctivitis, bilateral: Secondary | ICD-10-CM | POA: Diagnosis not present

## 2021-07-12 MED ORDER — GENTAMICIN SULFATE 0.3 % OP SOLN: 2.0000 [drp] | OPHTHALMIC | 0 refills | Status: DC

## 2021-07-12 NOTE — Discharge Instructions (Addendum)
Use 2 drops of gentamicin eyedrops into each eye every 4 hours for the next 7 days to treat your pinkeye.  Use cool compresses prior to applying gentamicin eyedrops to reduce swelling.  You may continue to take Motrin over-the-counter as needed for any eye pain you may have.  Change your pillowcase in the next 2 to 3 days to prevent reinfection and wash your hands frequently.  If you develop any new or worsening symptoms or do not improve in the next 2 to 3 days, please return.  If your symptoms are severe, please go to the emergency room.  Follow-up with your primary care provider for further evaluation and management of your symptoms as well as ongoing wellness visits.  I hope you feel better!

## 2021-07-12 NOTE — ED Triage Notes (Addendum)
Eye irritation ongoing since Sunday/ Patient states 2 weeks ago her 25 month old had red eyes, suspected bacterial/viral eye infection. States her 35 year old was kind of sick around that time as well.   Patient having red eyes, watering, and swelling in both eyes. No colored discharge from the eyes.   Patient having slight blurred vision and light sensitivity.

## 2021-07-12 NOTE — ED Provider Notes (Signed)
[facility]    CSN: [id number] Arrival date & time: 07/12/21  1803      History   Chief Complaint Chief Complaint  Patient presents with   Eye Pain   Conjunctivitis    HPI Amber Duran is a 36 y.o. female.   Patient presents to urgent care for evaluation of eye redness that started with her right eye on Sunday, July 2 and spread to her left eye yesterday Monday, July 3.  States that her 16-month-old child had similar symptoms, but more mild.  Her other children have also been sick recently and she believes that some of their germs could have been spread to her to cause her to have pinkeye.  She denies decreased visual acuity, but does report a little bit of blurry vision that she states is due to a gritty feeling in her eyes.  When she wakes up, she has a mild amount of eye crusting.  Drainage from her eyes is clear.  She has not attempted use of any over-the-counter medications prior to arriving to urgent care for her symptoms.  Denies headache, fever/chills, nasal congestion, sore throat, ear pain, and dizziness.   Eye Pain  Conjunctivitis    Past Medical History:  Diagnosis Date   Abnormal Pap smear    Abscess    underarms   Genital herpes in women    8 months    GERD (gastroesophageal reflux disease)    diet controlled - no meds   Hx of varicella    Hypertension     no meds   Pregnancy induced hypertension    Reflux    diet controlled - no meds   Trichomonas    Vaginal Pap smear, abnormal     Patient Active Problem List   Diagnosis Date Noted   Post-dates pregnancy 04/17/2017   SVD (spontaneous vaginal delivery) 04/17/2017   Postpartum care following vaginal delivery 04/17/2017   Recurrent pregnancy loss 10/12/2010    Past Surgical History:  Procedure Laterality Date   DILATION AND CURETTAGE OF UTERUS     DILATION AND EVACUATION  09/21/2010   Procedure: DILATATION AND EVACUATION (D&E);  Surgeon: Kathreen Cosier, MD;  Location: [facility];   Service: Gynecology;  Laterality: N/A;   EYE SURGERY      OB History     Gravida  7   Para  2   Term  2   Preterm      AB  5   Living  2      SAB  3   IAB  2   Ectopic      Multiple  0   Live Births  2            Home Medications    Prior to Admission medications   Medication Sig Start Date End Date Taking? Authorizing Provider  FLUoxetine (PROZAC) 20 MG capsule Take 20 mg by mouth daily. 06/23/21  Yes [provider]  gentamicin (GARAMYCIN) 0.3 % ophthalmic solution Place 2 drops into both eyes every 4 (four) hours. 07/12/21  Yes Carlisle Beers, FNP  norethindrone (MICRONOR) 0.35 MG tablet Take 1 tablet by mouth daily. 07/02/21  Yes [provider]    Family History Family History  Problem Relation Age of Onset   Miscarriages / Stillbirths Mother    Hypertension Mother    Crohn's disease Mother    Hypertension Father    Miscarriages / Stillbirths Maternal Aunt    Crohn's disease Maternal  Aunt    Diabetes Maternal Aunt    Hypertension Maternal Grandmother    Depression Maternal Grandmother    Hypertension Maternal Grandfather    Hypertension Paternal Grandmother    Hypertension Paternal Grandfather    Diabetes Maternal Uncle     Social History Social History   Tobacco Use   Smoking status: Former    Packs/day: 0.25    Years: 1.00    Total pack years: 0.25    Types: Cigarettes    Quit date: 09/15/1998    Years since quitting: 22.8   Smokeless tobacco: Never  Substance Use Topics   Alcohol use: No   Drug use: No     Allergies   Patient has no known allergies.   Review of Systems Review of Systems  Eyes:  Positive for pain.     Physical Exam Triage Vital Signs ED Triage Vitals  Enc Vitals Group     BP 07/12/21 1817 135/87     Pulse Rate 07/12/21 1817 79     Resp 07/12/21 1817 16     Temp 07/12/21 1817 (!) 97.2 F (36.2 C)     Temp Source 07/12/21 1817 Oral     SpO2 07/12/21 1817 96 %     Weight  07/12/21 1821 180 lb (81.6 kg)     Height 07/12/21 1821 5\' 2"  (1.575 m)     Head Circumference --      Peak Flow --      Pain Score 07/12/21 1820 8     Pain Loc --      Pain Edu? --      Excl. in [place]? --    No data found.  Updated Vital Signs BP 135/87 (BP Location: Left Arm)   Pulse 79   Temp (!) 97.2 F (36.2 C) (Oral)   Resp 16   Ht 5\' 2"  (1.575 m)   Wt 180 lb (81.6 kg)   LMP 07/04/2021 (Exact Date)   SpO2 96%   Breastfeeding No   BMI 32.92 kg/m   Visual Acuity Right Eye Distance: 20/20 Left Eye Distance: 20/15 Bilateral Distance: 20/20  Right Eye Near:   Left Eye Near:    Bilateral Near:     Physical Exam Vitals and nursing note reviewed.  Constitutional:      Appearance: Normal appearance. She is not ill-appearing or toxic-appearing.     Comments: Very pleasant patient sitting on exam in position of comfort table in no acute distress.   HENT:     Head: Normocephalic and atraumatic.     Right Ear: Hearing and external ear normal.     Left Ear: Hearing and external ear normal.     Nose: Nose normal.     Mouth/Throat:     Lips: Pink.     Mouth: Mucous membranes are moist.  Eyes:     General: Lids are normal. Vision grossly intact. Gaze aligned appropriately. No visual field deficit.    Extraocular Movements: Extraocular movements intact.     Conjunctiva/sclera:     Right eye: Right conjunctiva is injected.     Left eye: Left conjunctiva is injected.  Cardiovascular:     Rate and Rhythm: Normal rate and regular rhythm.     Heart sounds: Normal heart sounds, S1 normal and S2 normal.  Pulmonary:     Effort: Pulmonary effort is normal. No respiratory distress.     Breath sounds: Normal breath sounds and air entry.  Abdominal:     Palpations: Abdomen  is soft.  Musculoskeletal:     Cervical back: Neck supple.  Skin:    General: Skin is warm and dry.     Capillary Refill: Capillary refill takes less than 2 seconds.     Findings: No rash.  Neurological:      General: No focal deficit present.     Mental Status: She is alert and oriented to person, place, and time. Mental status is at baseline.     Cranial Nerves: No dysarthria or facial asymmetry.     Gait: Gait is intact.  Psychiatric:        Mood and Affect: Mood normal.        Speech: Speech normal.        Behavior: Behavior normal.        Thought Content: Thought content normal.        Judgment: Judgment normal.      UC Treatments / Results  Labs (all labs ordered are listed, but only abnormal results are displayed) Labs Reviewed - No data to display  EKG   Radiology No results found.  Procedures Procedures (including critical care time)  Medications Ordered in UC Medications - No data to display  Initial Impression / Assessment and Plan / UC Course  I have reviewed the triage vital signs and the nursing notes.  Pertinent labs & imaging results that were available during my care of the patient were reviewed by me and considered in my medical decision making (see chart for details).  Acute bacterial conjunctivitis of both eyes  Gentamicin 2 drops into each eye every 4 hours for the next 7 days prescribed.  Cool compresses to both eyes prior to eyedrop administration recommended to be continued.  Patient may take ibuprofen over-the-counter for any pain she may have related to her pinkeye infection.  Recommend patient change her pillowcase after 2 to 3 days of antibiotics and perform frequent handwashing to prevent spread of pinkeye to others.  Discussed physical exam and available lab work findings in clinic with patient.  Counseled patient regarding appropriate use of medications and potential side effects for all medications recommended or prescribed today. Discussed red flag signs and symptoms of worsening condition,when to call the PCP office, return to urgent care, and when to seek higher level of care in the emergency department. Patient verbalizes understanding and  agreement with plan. All questions answered. Patient discharged in stable condition.   Final Clinical Impressions(s) / UC Diagnoses   Final diagnoses:  Acute bacterial conjunctivitis of both eyes     Discharge Instructions      Use 2 drops of gentamicin eyedrops into each eye every 4 hours for the next 7 days to treat your pinkeye.  Use cool compresses prior to applying gentamicin eyedrops to reduce swelling.  You may continue to take Motrin over-the-counter as needed for any eye pain you may have.  Change your pillowcase in the next 2 to 3 days to prevent reinfection and wash your hands frequently.  If you develop any new or worsening symptoms or do not improve in the next 2 to 3 days, please return.  If your symptoms are severe, please go to the emergency room.  Follow-up with your primary care provider for further evaluation and management of your symptoms as well as ongoing wellness visits.  I hope you feel better!    ED Prescriptions     Medication Sig Dispense Auth. Provider   gentamicin (GARAMYCIN) 0.3 % ophthalmic solution Place 2 drops into  both eyes every 4 (four) hours. 5 mL Carlisle Beers, FNP      PDMP not reviewed this encounter.   Carlisle Beers, [place] 07/12/21 [phone]

## 2022-01-14 ENCOUNTER — Ambulatory Visit (HOSPITAL_COMMUNITY)
Admission: EM | Admit: 2022-01-14 | Discharge: 2022-01-14 | Disposition: A | Discharge status: Home / Self Care | Payer: Medicaid Other | Attending: Physician Assistant | Admitting: Physician Assistant

## 2022-01-14 ENCOUNTER — Encounter (HOSPITAL_COMMUNITY): Payer: Self-pay

## 2022-01-14 DIAGNOSIS — H6502 Acute serous otitis media, left ear: Secondary | ICD-10-CM | POA: Diagnosis not present

## 2022-01-14 DIAGNOSIS — M79672 Pain in left foot: Secondary | ICD-10-CM

## 2022-01-14 MED ORDER — IBUPROFEN 600 MG PO TABS: 600.0000 mg | ORAL_TABLET | Freq: Four times a day (QID) | ORAL | 0 refills | Status: DC | PRN

## 2022-01-14 MED ORDER — AMOXICILLIN 500 MG PO CAPS: 500.0000 mg | ORAL_CAPSULE | Freq: Three times a day (TID) | ORAL | 0 refills | Status: DC

## 2022-01-14 NOTE — ED Provider Notes (Signed)
[facility]    CSN: [id number] Arrival date & time: 01/14/22  1753      History   Chief Complaint Chief Complaint  Patient presents with   Ear Pain   LEFT FOOT PAIN    HPI Amber Duran is a 37 y.o. female.   37 year old female presents with left ear pain and left foot pain.  Patient indicates for the past couple days she has been having some increasing left ear pain, pressure, and discomfort.  Patient relates she has not have any drainage.  She relates she is been taking some Tylenol without relief from the pain.  Patient indicates she has had some mild upper respiratory congestion but has not been having any fever. Patient indicates that she is having left foot pain and discomfort which tends to be intermittent.  Patient indicates that it has been going on for the past couple days.  She indicates she has not had any trauma to the foot, twisted the foot, and no unusual swelling.  Patient indicates the pain is located at the mid dorsum of the foot and on the inside.  Patient indicates the pain is worse when she stands on the foot.  She has been using some Epsom salt soaks which has not given much relief from the discomfort.     Past Medical History:  Diagnosis Date   Abnormal Pap smear    Abscess    underarms   Genital herpes in women    8 months    GERD (gastroesophageal reflux disease)    diet controlled - no meds   Hx of varicella    Hypertension     no meds   Pregnancy induced hypertension    Reflux    diet controlled - no meds   Trichomonas    Vaginal Pap smear, abnormal     Patient Active Problem List   Diagnosis Date Noted   Post-dates pregnancy 04/17/2017   SVD (spontaneous vaginal delivery) 04/17/2017   Postpartum care following vaginal delivery 04/17/2017   Recurrent pregnancy loss 10/12/2010    Past Surgical History:  Procedure Laterality Date   DILATION AND CURETTAGE OF UTERUS     DILATION AND EVACUATION  09/21/2010   Procedure:  DILATATION AND EVACUATION (D&E);  Surgeon: Kathreen Cosier, MD;  Location: [facility];  Service: Gynecology;  Laterality: N/A;   EYE SURGERY      OB History     Gravida  7   Para  2   Term  2   Preterm      AB  5   Living  2      SAB  3   IAB  2   Ectopic      Multiple  0   Live Births  2            Home Medications    Prior to Admission medications   Medication Sig Start Date End Date Taking? Authorizing Provider  amoxicillin (AMOXIL) 500 MG capsule Take 1 capsule (500 mg total) by mouth 3 (three) times daily. 01/14/22  Yes Ellsworth Lennox, PA-C  ibuprofen (ADVIL) 600 MG tablet Take 1 tablet (600 mg total) by mouth every 6 (six) hours as needed. 01/14/22  Yes Ellsworth Lennox, PA-C  FLUoxetine (PROZAC) 20 MG capsule Take 20 mg by mouth daily. 06/23/21   [provider]  gentamicin (GARAMYCIN) 0.3 % ophthalmic solution Place 2 drops into both eyes every 4 (four) hours. 07/12/21   Carlisle Beers,  FNP  norethindrone (MICRONOR) 0.35 MG tablet Take 1 tablet by mouth daily. 07/02/21   [provider]    Family History Family History  Problem Relation Age of Onset   Miscarriages / Stillbirths Mother    Hypertension Mother    Crohn's disease Mother    Hypertension Father    Miscarriages / Stillbirths Maternal Aunt    Crohn's disease Maternal Aunt    Diabetes Maternal Aunt    Hypertension Maternal Grandmother    Depression Maternal Grandmother    Hypertension Maternal Grandfather    Hypertension Paternal Grandmother    Hypertension Paternal Grandfather    Diabetes Maternal Uncle     Social History Social History   Tobacco Use   Smoking status: Former    Packs/day: 0.25    Years: 1.00    Total pack years: 0.25    Types: Cigarettes    Quit date: 09/15/1998    Years since quitting: 23.3   Smokeless tobacco: Never  Substance Use Topics   Alcohol use: No   Drug use: No     Allergies   Patient has no known allergies.   Review of  Systems Review of Systems  HENT:  Positive for ear pain (left).   Musculoskeletal:  Positive for gait problem (left foot pain).     Physical Exam Triage Vital Signs ED Triage Vitals [01/14/22 1803]  Enc Vitals Group     BP (!) 148/85     Pulse Rate 79     Resp 16     Temp 98.5 F (36.9 C)     Temp Source Oral     SpO2 99 %     Weight      Height      Head Circumference      Peak Flow      Pain Score      Pain Loc      Pain Edu?      Excl. in GC?    No data found.  Updated Vital Signs BP (!) 148/85 (BP Location: Left Arm)   Pulse 88   Temp 98.5 F (36.9 C) (Oral)   Resp 16   SpO2 99%   Visual Acuity Right Eye Distance:   Left Eye Distance:   Bilateral Distance:    Right Eye Near:   Left Eye Near:    Bilateral Near:     Physical Exam Constitutional:      Appearance: Normal appearance.  HENT:     Right Ear: Tympanic membrane and ear canal normal.     Left Ear: Ear canal normal. Tympanic membrane is erythematous.     Mouth/Throat:     Mouth: Mucous membranes are moist.     Pharynx: Oropharynx is clear. No posterior oropharyngeal erythema.  Cardiovascular:     Rate and Rhythm: Normal rate and regular rhythm.     Heart sounds: Normal heart sounds.  Pulmonary:     Effort: Pulmonary effort is normal.     Breath sounds: Normal breath sounds and air entry. No wheezing, rhonchi or rales.  Feet:     Comments: Left foot: Full range of motion is normal, stability is intact, flexion extension and rotation are normal.  There is no unusual redness or swelling of the dorsum and plantar area of the foot. Left ankle: Full range of motion is intact, stability intact, no unusual redness or swelling. Lymphadenopathy:     Cervical: No cervical adenopathy.  Neurological:     Mental Status: She is  alert.      UC Treatments / Results  Labs (all labs ordered are listed, but only abnormal results are displayed) Labs Reviewed - No data to display  EKG   Radiology No  results found.  Procedures Procedures (including critical care time)  Medications Ordered in UC Medications - No data to display  Initial Impression / Assessment and Plan / UC Course  I have reviewed the triage vital signs and the nursing notes.  Pertinent labs & imaging results that were available during my care of the patient were reviewed by me and considered in my medical decision making (see chart for details).    Plan: 1.  The left acute otitis media will be treated with the following: A.  Amoxicillin 500 mg every 8 hours to treat the infection. B.  Ibuprofen 600 mg every 6 hours with food to treat pain or discomfort. 2.  The left foot pain will be treated with the following: A.  Ibuprofen 600 mg every 6 hours to treat pain or discomfort. 3.  Advised follow-up PCP or return to urgent care if symptoms fail to improve. Final Clinical Impressions(s) / UC Diagnoses   Final diagnoses:  Non-recurrent acute serous otitis media of left ear  Left foot pain     Discharge Instructions      Advised take ibuprofen 600 mg every 6 hours with food on a regular basis to treat the left ear pain and left foot pain.  Advised take amoxicillin 1 3 times a day until completed to treat the left ear infection.  Advised to follow-up with PCP or return to urgent care if symptoms fail to improve.    ED Prescriptions     Medication Sig Dispense Auth. Provider   amoxicillin (AMOXIL) 500 MG capsule Take 1 capsule (500 mg total) by mouth 3 (three) times daily. 21 capsule Nyoka Lint, PA-C   ibuprofen (ADVIL) 600 MG tablet Take 1 tablet (600 mg total) by mouth every 6 (six) hours as needed. 30 tablet Nyoka Lint, PA-C      PDMP not reviewed this encounter.   Nyoka Lint, PA-C 01/14/22 1827

## 2022-01-14 NOTE — ED Triage Notes (Signed)
Here for right ear pain that started today. Pt reports left foot has been hurting for days. No injuries or falls to note.

## 2022-01-14 NOTE — Discharge Instructions (Signed)
Advised take ibuprofen 600 mg every 6 hours with food on a regular basis to treat the left ear pain and left foot pain.  Advised take amoxicillin 1 3 times a day until completed to treat the left ear infection.  Advised to follow-up with PCP or return to urgent care if symptoms fail to improve.

## 2022-06-29 ENCOUNTER — Ambulatory Visit: Payer: Medicaid Other | Admitting: Podiatry

## 2022-08-10 ENCOUNTER — Ambulatory Visit (INDEPENDENT_AMBULATORY_CARE_PROVIDER_SITE_OTHER): Payer: Medicaid Other

## 2022-08-10 ENCOUNTER — Encounter: Payer: Self-pay | Admitting: Podiatry

## 2022-08-10 ENCOUNTER — Other Ambulatory Visit: Payer: Self-pay | Admitting: Podiatry

## 2022-08-10 ENCOUNTER — Ambulatory Visit (INDEPENDENT_AMBULATORY_CARE_PROVIDER_SITE_OTHER): Payer: Medicaid Other | Admitting: Podiatry

## 2022-08-10 DIAGNOSIS — M778 Other enthesopathies, not elsewhere classified: Secondary | ICD-10-CM

## 2022-08-10 DIAGNOSIS — M7751 Other enthesopathy of right foot: Secondary | ICD-10-CM

## 2022-08-10 DIAGNOSIS — B009 Herpesviral infection, unspecified: Secondary | ICD-10-CM | POA: Insufficient documentation

## 2022-08-10 DIAGNOSIS — N760 Acute vaginitis: Secondary | ICD-10-CM | POA: Insufficient documentation

## 2022-08-10 DIAGNOSIS — Z8679 Personal history of other diseases of the circulatory system: Secondary | ICD-10-CM | POA: Insufficient documentation

## 2022-08-10 DIAGNOSIS — M722 Plantar fascial fibromatosis: Secondary | ICD-10-CM

## 2022-08-10 MED ORDER — TRIAMCINOLONE ACETONIDE 40 MG/ML IJ SUSP
20.0000 mg | Freq: Once | INTRAMUSCULAR | Status: AC
  Administered 2022-08-10: 20 mg

## 2022-08-10 MED ORDER — MELOXICAM 15 MG PO TABS: 15.0000 mg | ORAL_TABLET | Freq: Every day | ORAL | 3 refills | Status: AC

## 2022-08-10 NOTE — Progress Notes (Signed)
Subjective:  Patient ID: Amber Duran, female    DOB: 05-07-1985,  MRN: [id number] HPI Chief Complaint  Patient presents with   Foot Pain    Plantar forefoot right - aching, burning, stinging, x 1 month, wearing arch binder, thought she might have plantar fasciitis    New Patient (Initial Visit)    37 y.o. female presents with the above complaint.   ROS: Denies fever chills nausea vomit muscle aches pains calf pain back pain chest pain shortness of breath indicates to me that her pain is just beneath the metatarsal heads particularly the third metatarsal head.  Past Medical History:  Diagnosis Date   Abnormal Pap smear    Abscess    underarms   Genital herpes in women    8 months    GERD (gastroesophageal reflux disease)    diet controlled - no meds   Hx of varicella    Hypertension     no meds   Pregnancy induced hypertension    Reflux    diet controlled - no meds   Trichomonas    Vaginal Pap smear, abnormal    Past Surgical History:  Procedure Laterality Date   DILATION AND CURETTAGE OF UTERUS     DILATION AND EVACUATION  09/21/2010   Procedure: DILATATION AND EVACUATION (D&E);  Surgeon: Kathreen Cosier, MD;  Location: [facility];  Service: Gynecology;  Laterality: N/A;   EYE SURGERY      Current Outpatient Medications:    ergocalciferol (VITAMIN D2) 1.25 MG (50000 UT) capsule, , Disp: , Rfl:    ferrous sulfate 325 (65 FE) MG tablet, Take 1 tablet by mouth 2 (two) times daily., Disp: , Rfl:    meloxicam (MOBIC) 15 MG tablet, Take 1 tablet (15 mg total) by mouth daily., Disp: 30 tablet, Rfl: 3   NIFEdipine (PROCARDIA XL/NIFEDICAL XL) 60 MG 24 hr tablet, Take by mouth., Disp: , Rfl:    FLUoxetine (PROZAC) 20 MG capsule, Take 20 mg by mouth daily., Disp: , Rfl:   No Known Allergies Review of Systems Objective:  There were no vitals filed for this visit.  General: Well developed, nourished, in no acute distress, alert and oriented x3   Dermatological: Skin is  warm, dry and supple bilateral. Nails x 10 are well maintained; remaining integument appears unremarkable at this time. There are no open sores, no preulcerative lesions, no rash or signs of infection present.  Vascular: Dorsalis Pedis artery and Posterior Tibial artery pedal pulses are 2/4 bilateral with immedate capillary fill time. Pedal hair growth present. No varicosities and no lower extremity edema present bilateral.   Neruologic: Grossly intact via light touch bilateral. Vibratory intact via tuning fork bilateral. Protective threshold with Semmes Wienstein monofilament intact to all pedal sites bilateral. Patellar and Achilles deep tendon reflexes 2+ bilateral. No Babinski or clonus noted bilateral.   Musculoskeletal: No gross boney pedal deformities bilateral. No pain, crepitus, or limitation noted with foot and ankle range of motion bilateral. Muscular strength 5/5 in all groups tested bilateral.  She has pain on end range of motion of the third metatarsal phalangeal joint less on the fourth and the second.  No palpable masses.  Her toe sits rectus.  Gait: Unassisted, Nonantalgic.    Radiographs:  Radiographs taken today demonstrate an osseously mature individual with good bone mineralization.  Right foot demonstrates a slightly elongated second metatarsal however no digital deformities are identified no acute findings are noted.    Assessment & Plan:  Assessment: Capsulitis third metatarsal phalangeal joint.  Plan: Discussed etiology pathology conservative surgical therapies discussed appropriate shoe gear stretching exercise ice therapy sugar modifications.  At this point were going to simply place an injection around the metatarsal head at the third metatarsal phalangeal joint.  And I will have her back in about 4 to 6 weeks.     Dante Roudebush T. [place], [place]

## 2022-09-21 ENCOUNTER — Ambulatory Visit: Payer: Medicaid Other | Admitting: Podiatry

## 2022-10-03 ENCOUNTER — Ambulatory Visit: Payer: Medicaid Other | Admitting: Podiatry

## 2023-07-27 ENCOUNTER — Encounter (HOSPITAL_COMMUNITY): Payer: Self-pay

## 2023-07-27 ENCOUNTER — Ambulatory Visit (HOSPITAL_COMMUNITY)
Admission: EM | Admit: 2023-07-27 | Discharge: 2023-07-27 | Disposition: A | Discharge status: Home / Self Care | Attending: Emergency Medicine | Admitting: Emergency Medicine

## 2023-07-27 DIAGNOSIS — U071 COVID-19: Secondary | ICD-10-CM | POA: Diagnosis not present

## 2023-07-27 LAB — POC SARS CORONAVIRUS 2 AG -  ED: SARS Coronavirus 2 Ag: POSITIVE — AB

## 2023-07-27 NOTE — Discharge Instructions (Signed)
 Your COVID test was positive today. I recommend alternating between 650 mg of Tylenol  and 400 mg of ibuprofen  as needed for sore throat, ear pain, or any fever. You can also take over-the-counter Mucinex for any cough or congestion if this does occur. Otherwise make sure you are staying hydrated and getting plenty of rest. Follow-up with your primary care provider or return here as needed.

## 2023-07-27 NOTE — ED Provider Notes (Signed)
 [facility]    CSN: [id number] Arrival date & time: 07/27/23  1742      History   Chief Complaint Chief Complaint  Patient presents with   Otalgia   Sore Throat    HPI Amber Duran is a 38 y.o. female.   Patient presents with sore throat and left ear pain that began 2 days ago.  Patient states that she is starting to have some pain to her right ear as well.  Denies congestion, sore throat, fever, body aches, chills, nausea, vomiting, and diarrhea.  Patient denies any known sick exposures.  Patient denies taking any medication for her symptoms.  The history is provided by the patient and medical records.  Otalgia Sore Throat    Past Medical History:  Diagnosis Date   Abnormal Pap smear    Abscess    underarms   Genital herpes in women    8 months    GERD (gastroesophageal reflux disease)    diet controlled - no meds   Hx of varicella    Hypertension     no meds   Pregnancy induced hypertension    Reflux    diet controlled - no meds   Trichomonas    Vaginal Pap smear, abnormal     Patient Active Problem List   Diagnosis Date Noted   Herpes simplex 08/10/2022   History of hypertension 08/10/2022   Vaginitis 08/10/2022   Postpartum depression 12/28/2020   Chronic hypertension 11/30/2020   Hidradenitis suppurativa 04/22/2020   History of recurrent miscarriages 04/22/2020   Post-dates pregnancy 04/17/2017   SVD (spontaneous vaginal delivery) 04/17/2017   Postpartum care following vaginal delivery 04/17/2017   Recurrent pregnancy loss 10/12/2010    Past Surgical History:  Procedure Laterality Date   DILATION AND CURETTAGE OF UTERUS     DILATION AND EVACUATION  09/21/2010   Procedure: DILATATION AND EVACUATION (D&E);  Surgeon: Aida DELENA Na, MD;  Location: [facility];  Service: Gynecology;  Laterality: N/A;   EYE SURGERY      OB History     Gravida  7   Para  2   Term  2   Preterm      AB  5   Living  2      SAB  3   IAB   2   Ectopic      Multiple  0   Live Births  2            Home Medications    Prior to Admission medications   Medication Sig Start Date End Date Taking? Authorizing Provider  ergocalciferol (VITAMIN D2) 1.25 MG (50000 UT) capsule  07/09/22   [provider]  ferrous sulfate 325 (65 FE) MG tablet Take 1 tablet by mouth 2 (two) times daily. Patient not taking: Reported on 07/27/2023 12/20/20   [provider]  FLUoxetine (PROZAC) 20 MG capsule Take 20 mg by mouth daily. 06/23/21   [provider]  meloxicam  (MOBIC ) 15 MG tablet Take 1 tablet (15 mg total) by mouth daily. 08/10/22   Hyatt, Max T, DPM  NIFEdipine (PROCARDIA XL/NIFEDICAL XL) 60 MG 24 hr tablet Take by mouth. Patient not taking: Reported on 07/27/2023 10/25/20   [provider]    Family History Family History  Problem Relation Age of Onset   Miscarriages / Stillbirths Mother    Hypertension Mother    Crohn's disease Mother    Hypertension Father    Miscarriages / Stillbirths Maternal  Aunt    Crohn's disease Maternal Aunt    Diabetes Maternal Aunt    Hypertension Maternal Grandmother    Depression Maternal Grandmother    Hypertension Maternal Grandfather    Hypertension Paternal Grandmother    Hypertension Paternal Grandfather    Diabetes Maternal Uncle     Social History Social History   Tobacco Use   Smoking status: Former    Current packs/day: 0.00    Average packs/day: 0.3 packs/day for 1 year (0.3 ttl pk-yrs)    Types: Cigarettes    Start date: 09/14/1997    Quit date: 09/15/1998    Years since quitting: 24.8   Smokeless tobacco: Never  Vaping Use   Vaping status: Never Used  Substance Use Topics   Alcohol use: No   Drug use: No     Allergies   Patient has no known allergies.   Review of Systems Review of Systems  HENT:  Positive for ear pain.    Per HPI  Physical Exam Triage Vital Signs ED Triage Vitals  Encounter Vitals Group     BP 07/27/23  1802 122/81     Girls Systolic BP Percentile --      Girls Diastolic BP Percentile --      Boys Systolic BP Percentile --      Boys Diastolic BP Percentile --      Pulse Rate 07/27/23 1802 75     Resp 07/27/23 1802 16     Temp 07/27/23 1802 98 F (36.7 C)     Temp Source 07/27/23 1802 Oral     SpO2 07/27/23 1802 95 %     Weight --      Height --      Head Circumference --      Peak Flow --      Pain Score 07/27/23 1801 9     Pain Loc --      Pain Education --      Exclude from Growth Chart --    No data found.  Updated Vital Signs BP 122/81 (BP Location: Left Arm)   Pulse 75   Temp 98 F (36.7 C) (Oral)   Resp 16   LMP 05/28/2023   SpO2 95%   Visual Acuity Right Eye Distance:   Left Eye Distance:   Bilateral Distance:    Right Eye Near:   Left Eye Near:    Bilateral Near:     Physical Exam Vitals and nursing note reviewed.  Constitutional:      General: She is awake. She is not in acute distress.    Appearance: Normal appearance. She is well-developed and well-groomed. She is not ill-appearing.  HENT:     Right Ear: Tympanic membrane, ear canal and external ear normal.     Left Ear: Tympanic membrane, ear canal and external ear normal.     Nose: Nose normal.     Mouth/Throat:     Mouth: Mucous membranes are moist.     Pharynx: Posterior oropharyngeal erythema and postnasal drip present. No oropharyngeal exudate.  Cardiovascular:     Rate and Rhythm: Normal rate and regular rhythm.  Pulmonary:     Effort: Pulmonary effort is normal.     Breath sounds: Normal breath sounds.  Skin:    General: Skin is warm and dry.  Neurological:     Mental Status: She is alert.  Psychiatric:        Behavior: Behavior is cooperative.      UC Treatments /  Results  Labs (all labs ordered are listed, but only abnormal results are displayed) Labs Reviewed  POC SARS CORONAVIRUS 2 AG -  ED - Abnormal; Notable for the following components:      Result Value   SARS  Coronavirus 2 Ag Positive (*)    All other components within normal limits    EKG   Radiology No results found.  Procedures Procedures (including critical care time)  Medications Ordered in UC Medications - No data to display  Initial Impression / Assessment and Plan / UC Course  I have reviewed the triage vital signs and the nursing notes.  Pertinent labs & imaging results that were available during my care of the patient were reviewed by me and considered in my medical decision making (see chart for details).     Patient is overall well-appearing.  Vitals are stable.  Upon assessment erythema and PND noted to pharynx.  No other significant findings upon exam.  Lungs clear bilaterally on auscultation.  COVID testing positive.  Recommended over-the-counter medication as needed for symptoms.  Discussed follow-up and return precautions Final Clinical Impressions(s) / UC Diagnoses   Final diagnoses:  COVID-19     Discharge Instructions      Your COVID test was positive today. I recommend alternating between 650 mg of Tylenol  and 400 mg of ibuprofen  as needed for sore throat, ear pain, or any fever. You can also take over-the-counter Mucinex for any cough or congestion if this does occur. Otherwise make sure you are staying hydrated and getting plenty of rest. Follow-up with your primary care provider or return here as needed.   ED Prescriptions   None    PDMP not reviewed this encounter.   Johnie Flaming A, NP 07/27/23 1840

## 2023-07-27 NOTE — ED Triage Notes (Signed)
 Patient  c/o sore throat, and left ear pain x 2 days.  Patient denies taking any medication for her symptoms.
